# Patient Record
Sex: Female | Born: 1940 | Race: Black or African American | Hispanic: No | Smoking: Former smoker
Health system: Southern US, Community
[De-identification: ages and names within clinical notes are randomized; demographics above are authoritative.]

## PROBLEM LIST (undated history)

## (undated) DIAGNOSIS — R011 Cardiac murmur, unspecified: Secondary | ICD-10-CM

## (undated) DIAGNOSIS — M797 Fibromyalgia: Secondary | ICD-10-CM

## (undated) DIAGNOSIS — E039 Hypothyroidism, unspecified: Secondary | ICD-10-CM

## (undated) DIAGNOSIS — I10 Essential (primary) hypertension: Secondary | ICD-10-CM

## (undated) DIAGNOSIS — M069 Rheumatoid arthritis, unspecified: Secondary | ICD-10-CM

## (undated) DIAGNOSIS — Q385 Congenital malformations of palate, not elsewhere classified: Secondary | ICD-10-CM

## (undated) DIAGNOSIS — R7301 Impaired fasting glucose: Secondary | ICD-10-CM

## (undated) DIAGNOSIS — M7061 Trochanteric bursitis, right hip: Secondary | ICD-10-CM

## (undated) DIAGNOSIS — B0229 Other postherpetic nervous system involvement: Secondary | ICD-10-CM

## (undated) DIAGNOSIS — J45909 Unspecified asthma, uncomplicated: Secondary | ICD-10-CM

## (undated) HISTORY — DX: Rheumatoid arthritis, unspecified: M06.9

## (undated) HISTORY — DX: Other postherpetic nervous system involvement: B02.29

## (undated) HISTORY — DX: Cardiac murmur, unspecified: R01.1

## (undated) HISTORY — DX: Essential (primary) hypertension: I10

## (undated) HISTORY — DX: Unspecified asthma, uncomplicated: J45.909

## (undated) HISTORY — DX: Impaired fasting glucose: R73.01

## (undated) HISTORY — PX: TONSILLECTOMY AND ADENOIDECTOMY: SUR1326

## (undated) HISTORY — DX: Congenital malformations of palate, not elsewhere classified: Q38.5

## (undated) HISTORY — DX: Hypothyroidism, unspecified: E03.9

## (undated) HISTORY — DX: Trochanteric bursitis, right hip: M70.61

## (undated) HISTORY — DX: Hypercalcemia: E83.52

## (undated) HISTORY — DX: Fibromyalgia: M79.7

---

## 1968-07-20 HISTORY — PX: CLEFT PALATE REPAIR: SUR1165

## 1986-07-20 HISTORY — PX: TOTAL ABDOMINAL HYSTERECTOMY W/ BILATERAL SALPINGOOPHORECTOMY: SHX83

## 1986-07-20 HISTORY — PX: BREAST BIOPSY: SHX20

## 1988-07-20 HISTORY — PX: LUMBAR DISC SURGERY: SHX700

## 1998-01-04 ENCOUNTER — Other Ambulatory Visit: Admission: RE | Admit: 1998-01-04 | Discharge: 1998-01-04 | Payer: Self-pay | Admitting: Obstetrics and Gynecology

## 1998-02-04 ENCOUNTER — Encounter: Admission: RE | Admit: 1998-02-04 | Discharge: 1998-05-05 | Payer: Self-pay | Admitting: Infectious Diseases

## 1998-05-24 ENCOUNTER — Encounter (HOSPITAL_COMMUNITY): Admission: RE | Admit: 1998-05-24 | Discharge: 1998-08-22 | Payer: Self-pay | Admitting: Dentistry

## 1998-11-27 ENCOUNTER — Encounter (HOSPITAL_COMMUNITY): Admission: RE | Admit: 1998-11-27 | Discharge: 1999-02-25 | Payer: Self-pay | Admitting: Dentistry

## 2001-02-25 ENCOUNTER — Ambulatory Visit (HOSPITAL_COMMUNITY): Admission: RE | Admit: 2001-02-25 | Discharge: 2001-02-25 | Payer: Self-pay | Admitting: Gastroenterology

## 2001-08-08 ENCOUNTER — Encounter: Admission: RE | Admit: 2001-08-08 | Discharge: 2001-08-08 | Payer: Self-pay

## 2009-03-04 ENCOUNTER — Encounter: Admission: RE | Admit: 2009-03-04 | Discharge: 2009-03-04 | Payer: Self-pay | Admitting: Family Medicine

## 2010-02-13 ENCOUNTER — Encounter: Admission: RE | Admit: 2010-02-13 | Discharge: 2010-02-13 | Payer: Self-pay | Admitting: Rheumatology

## 2010-02-25 ENCOUNTER — Encounter: Admission: RE | Admit: 2010-02-25 | Discharge: 2010-02-25 | Payer: Self-pay | Admitting: Family Medicine

## 2010-10-17 ENCOUNTER — Encounter: Payer: Self-pay | Admitting: Family Medicine

## 2010-10-17 ENCOUNTER — Ambulatory Visit (INDEPENDENT_AMBULATORY_CARE_PROVIDER_SITE_OTHER): Payer: BC Managed Care – PPO | Admitting: Family Medicine

## 2010-10-17 DIAGNOSIS — B0229 Other postherpetic nervous system involvement: Secondary | ICD-10-CM | POA: Insufficient documentation

## 2010-10-17 DIAGNOSIS — I10 Essential (primary) hypertension: Secondary | ICD-10-CM | POA: Insufficient documentation

## 2010-10-17 DIAGNOSIS — R030 Elevated blood-pressure reading, without diagnosis of hypertension: Secondary | ICD-10-CM

## 2010-10-17 DIAGNOSIS — R32 Unspecified urinary incontinence: Secondary | ICD-10-CM

## 2010-10-17 DIAGNOSIS — IMO0001 Reserved for inherently not codable concepts without codable children: Secondary | ICD-10-CM

## 2010-10-17 DIAGNOSIS — E039 Hypothyroidism, unspecified: Secondary | ICD-10-CM

## 2010-10-17 DIAGNOSIS — J45909 Unspecified asthma, uncomplicated: Secondary | ICD-10-CM | POA: Insufficient documentation

## 2010-10-17 DIAGNOSIS — M797 Fibromyalgia: Secondary | ICD-10-CM | POA: Insufficient documentation

## 2010-10-17 DIAGNOSIS — N390 Urinary tract infection, site not specified: Secondary | ICD-10-CM

## 2010-10-17 DIAGNOSIS — J309 Allergic rhinitis, unspecified: Secondary | ICD-10-CM | POA: Insufficient documentation

## 2010-10-17 LAB — POCT URINALYSIS DIPSTICK
Blood, UA: NEGATIVE
Glucose, UA: NEGATIVE
Nitrite, UA: NEGATIVE
Protein, UA: NEGATIVE
Spec Grav, UA: 1.02
Urobilinogen, UA: 0.2
pH, UA: 6

## 2010-10-17 NOTE — Assessment & Plan Note (Signed)
Uses Aleve and cyclobenzaprine and lyrica.

## 2010-10-17 NOTE — Progress Notes (Signed)
  Subjective:    Patient ID: Kiara Clayton, female    DOB: 10/20/1940, 70 y.o.   MRN: [id number]  HPI  Asthma - She does have an inhaler at home but she doesn't know which one. She says she rarely uses it. She will let us know the next time she is here.   Says home BP is usually 108/70s.  Says BP is often high when goes to the doctors office  Says she started thyroid med about 1 year ago. Doing well. Deneis any thyroid sxs.  She think she is not due for labs until may.   C/O of urinary leaking for one month. Says will urinat and then a few minutes later will sit down and leak a little. Not every time. C\coming nad going. She wondered if it was her meds. None of her meds are new. No pelvic pain or fever.   Review of Systems  Constitutional: Negative for diaphoresis and unexpected weight change.  HENT: Negative for hearing loss, sneezing, postnasal drip and tinnitus.   Eyes: Positive for visual disturbance.  Respiratory: Negative for cough and wheezing.   Cardiovascular: Positive for chest pain and palpitations.  Genitourinary: Negative for vaginal bleeding and vaginal discharge.       Nocturia  Musculoskeletal: Positive for myalgias and arthralgias.  Neurological: Negative for headaches.  Hematological: Negative for adenopathy. Does not bruise/bleed easily.  Psychiatric/Behavioral: Positive for sleep disturbance and dysphoric mood.       Objective:   Physical Exam  Constitutional: She appears well-developed and well-nourished.  HENT:  Head: Normocephalic and atraumatic.       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   Cardiovascular: Normal rate, regular rhythm and normal heart sounds.   Pulmonary/Chest: Effort normal and breath sounds normal.          Assessment & Plan:  Urinary leaking - UA only + for LE. Will send for culture. If neg consider kegel exercises, or possible medication.

## 2010-10-17 NOTE — Assessment & Plan Note (Signed)
Continue zyrtec. She says this controls her sxs well.  No change to her regimen.

## 2010-10-17 NOTE — Assessment & Plan Note (Signed)
She thinks she is due to repeat this In May but not sure.

## 2010-10-17 NOTE — Assessment & Plan Note (Signed)
Repeat BP at goal.

## 2010-10-20 ENCOUNTER — Telehealth: Payer: Self-pay | Admitting: Family Medicine

## 2010-10-20 LAB — URINE CULTURE: Colony Count: >=100000

## 2010-10-20 MED ORDER — NITROFURANTOIN MACROCRYSTAL 100 MG PO CAPS: 100.0000 mg | ORAL_CAPSULE | Freq: Four times a day (QID) | ORAL | Status: AC

## 2010-10-20 NOTE — Telephone Encounter (Signed)
Call pt: Culture was + for a UTI.  Abx sent to pharm. If still having sxs after complete the abx then nurse visit to recheck the urine.

## 2010-10-20 NOTE — Telephone Encounter (Signed)
Pt.notified

## 2010-11-14 ENCOUNTER — Ambulatory Visit (INDEPENDENT_AMBULATORY_CARE_PROVIDER_SITE_OTHER): Payer: BC Managed Care – PPO | Admitting: Family Medicine

## 2010-11-14 DIAGNOSIS — N390 Urinary tract infection, site not specified: Secondary | ICD-10-CM

## 2010-11-14 LAB — POCT URINALYSIS DIPSTICK
Bilirubin, UA: NEGATIVE
Blood, UA: NEGATIVE
Glucose, UA: NEGATIVE
Spec Grav, UA: 1.025
Urobilinogen, UA: 0.2

## 2010-11-14 NOTE — Progress Notes (Signed)
  Subjective:    Patient ID: Kiara Clayton, female    DOB: 1941-01-15, 70 y.o.   MRN: [id number]  HPI Leaking urine. Wants to make sure not infection.    Review of Systems     Objective:   Physical Exam        Assessment & Plan:  U/A only + for LE.Will send for culture. Called pt: voicemail hasn't been set up to leave a message. No answer.

## 2010-11-19 ENCOUNTER — Telehealth: Payer: Self-pay | Admitting: Family Medicine

## 2010-11-19 NOTE — Telephone Encounter (Signed)
Call patient: Please let her know her urine culture was re\re incubated for better growth. As soon as we have the final results we will her neck.

## 2010-11-19 NOTE — Telephone Encounter (Signed)
vm has not been set up. Pt states she does not answer phone when she was in the office. She will call us back this week or next week

## 2010-11-21 ENCOUNTER — Telehealth: Payer: Self-pay | Admitting: Family Medicine

## 2010-11-21 LAB — URINE CULTURE: Colony Count: >=100000

## 2010-11-21 MED ORDER — NITROFURANTOIN MACROCRYSTAL 100 MG PO CAPS: 100.0000 mg | ORAL_CAPSULE | Freq: Four times a day (QID) | ORAL | Status: AC

## 2010-11-21 NOTE — Telephone Encounter (Addendum)
Called and there was no answer. Pt stated in the office that she doesn't answer her phone and her vm has not been set up.Pt states she will call next week if she has not heard from [place]

## 2010-11-21 NOTE — Telephone Encounter (Signed)
Call patient: Urine culture was positive. We will call in a prescription for nitrofurantoin.

## 2010-12-03 ENCOUNTER — Encounter: Payer: Self-pay | Admitting: *Deleted

## 2011-01-30 ENCOUNTER — Other Ambulatory Visit: Payer: Self-pay | Admitting: Family Medicine

## 2011-01-30 DIAGNOSIS — Z1231 Encounter for screening mammogram for malignant neoplasm of breast: Secondary | ICD-10-CM

## 2011-02-02 ENCOUNTER — Other Ambulatory Visit: Payer: Self-pay | Admitting: Family Medicine

## 2011-02-03 MED ORDER — LEVOTHYROXINE SODIUM 88 MCG PO TABS: 88.0000 ug | ORAL_TABLET | Freq: Every day | ORAL | Status: DC

## 2011-02-03 MED ORDER — CYCLOBENZAPRINE HCL 10 MG PO TABS: 10.0000 mg | ORAL_TABLET | Freq: Two times a day (BID) | ORAL | Status: DC | PRN

## 2011-03-03 ENCOUNTER — Ambulatory Visit
Admission: RE | Admit: 2011-03-03 | Discharge: 2011-03-03 | Disposition: A | Discharge status: Home / Self Care | Payer: BC Managed Care – PPO | Source: Ambulatory Visit | Attending: Family Medicine | Admitting: Family Medicine

## 2011-03-03 ENCOUNTER — Telehealth: Payer: Self-pay | Admitting: *Deleted

## 2011-03-03 DIAGNOSIS — E039 Hypothyroidism, unspecified: Secondary | ICD-10-CM

## 2011-03-03 DIAGNOSIS — IMO0001 Reserved for inherently not codable concepts without codable children: Secondary | ICD-10-CM

## 2011-03-03 DIAGNOSIS — Z1231 Encounter for screening mammogram for malignant neoplasm of breast: Secondary | ICD-10-CM

## 2011-03-03 DIAGNOSIS — E785 Hyperlipidemia, unspecified: Secondary | ICD-10-CM

## 2011-03-03 NOTE — Telephone Encounter (Signed)
Pt needs labs.

## 2011-03-04 ENCOUNTER — Telehealth: Payer: Self-pay | Admitting: Family Medicine

## 2011-03-04 LAB — LIPID PANEL
HDL: 58 mg/dL (ref >39)
LDL Cholesterol: 201 mg/dL — ABNORMAL HIGH (ref 0–99)
Total CHOL/HDL Ratio: 5 Ratio
VLDL: 32 mg/dL (ref 0–40)

## 2011-03-04 LAB — COMPLETE METABOLIC PANEL WITH GFR
ALT: 19 U/L (ref 0–35)
AST: 25 U/L (ref 0–37)
Albumin: 4.6 g/dL (ref 3.5–5.2)
Alkaline Phosphatase: 76 U/L (ref 39–117)
Potassium: 4 mEq/L (ref 3.5–5.3)
Sodium: 140 mEq/L (ref 135–145)
Total Bilirubin: 0.6 mg/dL (ref 0.3–1.2)
Total Protein: 8.3 g/dL (ref 6.0–8.3)

## 2011-03-04 LAB — TSH: TSH: 2.659 u[IU]/mL (ref 0.350–4.500)

## 2011-03-04 NOTE — Telephone Encounter (Signed)
Call patient: Complete metabolic panel is normal except for her calcium is just a little borderline elevated. I would like to recheck this in one month. Her cholesterol is way too high. She really needs to be on cholesterol medication at bedtime. Her thyroid level is normal. Let me know if this is a patient who doesn't have an active phone number.

## 2011-03-05 ENCOUNTER — Telehealth: Payer: Self-pay | Admitting: Family Medicine

## 2011-03-05 NOTE — Telephone Encounter (Signed)
Patient: Her mammogram was slightly abnormal and they should be contacting her to do additional studies.

## 2011-03-05 NOTE — Telephone Encounter (Signed)
LM on VM with contact Olegario Messier her niece to have pt call office for lab results and to give [place] a number. KJ LPN

## 2011-03-06 ENCOUNTER — Telehealth: Payer: Self-pay | Admitting: Family Medicine

## 2011-03-06 ENCOUNTER — Other Ambulatory Visit: Payer: Self-pay | Admitting: Family Medicine

## 2011-03-06 DIAGNOSIS — Z1211 Encounter for screening for malignant neoplasm of colon: Secondary | ICD-10-CM

## 2011-03-06 DIAGNOSIS — R928 Other abnormal and inconclusive findings on diagnostic imaging of breast: Secondary | ICD-10-CM

## 2011-03-06 NOTE — Telephone Encounter (Signed)
Patient was in the office and ask for a referral to get a colonoscopy in [place]

## 2011-03-06 NOTE — Telephone Encounter (Signed)
Addended by: Nani Gasser D on: 03/06/2011 10:39 AM   Modules accepted: Orders

## 2011-03-06 NOTE — Telephone Encounter (Signed)
Referral entered  

## 2011-03-06 NOTE — Telephone Encounter (Signed)
Pt notified via her niece of lab results. KJ LPN

## 2011-03-10 ENCOUNTER — Encounter: Payer: Self-pay | Admitting: *Deleted

## 2011-03-10 NOTE — Telephone Encounter (Signed)
Pt's niece was notified and will mail letter and let niece know that we need to get a phone number for her that we can reach her

## 2011-03-19 ENCOUNTER — Ambulatory Visit
Admission: RE | Admit: 2011-03-19 | Discharge: 2011-03-19 | Disposition: A | Discharge status: Home / Self Care | Payer: Medicare Other | Source: Ambulatory Visit | Attending: Family Medicine | Admitting: Family Medicine

## 2011-03-19 ENCOUNTER — Other Ambulatory Visit: Payer: Self-pay | Admitting: Family Medicine

## 2011-03-19 DIAGNOSIS — Z9889 Other specified postprocedural states: Secondary | ICD-10-CM

## 2011-03-19 DIAGNOSIS — R928 Other abnormal and inconclusive findings on diagnostic imaging of breast: Secondary | ICD-10-CM

## 2011-03-20 ENCOUNTER — Ambulatory Visit
Admission: RE | Admit: 2011-03-20 | Discharge: 2011-03-20 | Disposition: A | Discharge status: Home / Self Care | Payer: Medicare Other | Source: Ambulatory Visit | Attending: Family Medicine | Admitting: Family Medicine

## 2011-03-20 DIAGNOSIS — Z9889 Other specified postprocedural states: Secondary | ICD-10-CM

## 2011-04-23 ENCOUNTER — Other Ambulatory Visit: Payer: Self-pay | Admitting: *Deleted

## 2011-04-23 MED ORDER — LEVOTHYROXINE SODIUM 88 MCG PO TABS: 88.0000 ug | ORAL_TABLET | Freq: Every day | ORAL | Status: DC

## 2011-04-23 MED ORDER — CYCLOBENZAPRINE HCL 10 MG PO TABS: 10.0000 mg | ORAL_TABLET | Freq: Two times a day (BID) | ORAL | Status: DC | PRN

## 2011-10-20 ENCOUNTER — Other Ambulatory Visit: Payer: Self-pay | Admitting: Family Medicine

## 2011-11-23 ENCOUNTER — Encounter: Payer: Self-pay | Admitting: Family Medicine

## 2011-11-23 ENCOUNTER — Ambulatory Visit (INDEPENDENT_AMBULATORY_CARE_PROVIDER_SITE_OTHER): Payer: Medicare Other | Admitting: Family Medicine

## 2011-11-23 VITALS — BP 152/83 | HR 98 | Ht 66.0 in | Wt 190.0 lb

## 2011-11-23 DIAGNOSIS — R030 Elevated blood-pressure reading, without diagnosis of hypertension: Secondary | ICD-10-CM

## 2011-11-23 DIAGNOSIS — B0229 Other postherpetic nervous system involvement: Secondary | ICD-10-CM

## 2011-11-23 DIAGNOSIS — E039 Hypothyroidism, unspecified: Secondary | ICD-10-CM

## 2011-11-23 DIAGNOSIS — IMO0001 Reserved for inherently not codable concepts without codable children: Secondary | ICD-10-CM

## 2011-11-23 MED ORDER — CYCLOBENZAPRINE HCL 10 MG PO TABS: 10.0000 mg | ORAL_TABLET | Freq: Two times a day (BID) | ORAL | Status: DC | PRN

## 2011-11-23 NOTE — Patient Instructions (Signed)
We will call you with your lab results. If you don't here from [place] in about a week then please give [place] a call at [phone]. We also need to check you choleseterol soon too.

## 2011-11-23 NOTE — Progress Notes (Addendum)
  Subjective:    Patient ID: Kiara Clayton, female    DOB: [birth year], 71 y.o.   MRN: [id number]  HPI  Thyroid - She is doing well. No major change in weight. Says the lyrica made he gain a few pounds but says she has [place] bout lost those.  No skin or hair changes.  Taking her medication regularly.    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  Used to be on Lyrica for it.  Wearing pants too long will aggrevate her sxs as well.   Review of Systems     Objective:   Physical Exam  Constitutional: She is oriented to person, place, and time. She appears well-developed and well-nourished.  HENT:  Head: Normocephalic and atraumatic.  Neck: Neck supple. No thyromegaly present.  Cardiovascular: Normal rate, regular rhythm and normal heart sounds.   Pulmonary/Chest: Effort normal and breath sounds normal.  Lymphadenopathy:    She has no cervical adenopathy.  Neurological: She is alert and oriented to person, place, and time.  Skin: Skin is warm and dry.  Psychiatric: She has a normal mood and affect. Her behavior is normal.          Assessment & Plan:  Thyroid - Recheck TSH. Has been over a year.    Elevated BP- High today. Return in 2 weeks for nurse BP check.  Post herpetic neuralgia. Declines any med today.

## 2011-11-24 ENCOUNTER — Other Ambulatory Visit: Payer: Self-pay | Admitting: *Deleted

## 2011-11-24 LAB — COMPLETE METABOLIC PANEL WITH GFR
ALT: 32 U/L (ref 0–35)
AST: 37 U/L (ref 0–37)
Calcium: 10.4 mg/dL (ref 8.4–10.5)
Chloride: 106 mEq/L (ref 96–112)
Creat: 0.83 mg/dL (ref 0.50–1.10)
Potassium: 3.8 mEq/L (ref 3.5–5.3)

## 2011-11-24 LAB — TSH: TSH: 3.384 u[IU]/mL (ref 0.350–4.500)

## 2011-11-24 MED ORDER — LEVOTHYROXINE SODIUM 88 MCG PO TABS: 88.0000 ug | ORAL_TABLET | Freq: Every day | ORAL | Status: DC

## 2011-11-24 NOTE — Progress Notes (Signed)
Quick Note:  All labs are normal. ______ 

## 2011-11-26 ENCOUNTER — Encounter: Payer: Self-pay | Admitting: Family Medicine

## 2011-11-26 ENCOUNTER — Ambulatory Visit (INDEPENDENT_AMBULATORY_CARE_PROVIDER_SITE_OTHER): Payer: Medicare Other | Admitting: Family Medicine

## 2011-11-26 VITALS — BP 158/81 | HR 94 | Ht 66.0 in | Wt 193.0 lb

## 2011-11-26 DIAGNOSIS — E039 Hypothyroidism, unspecified: Secondary | ICD-10-CM

## 2011-11-26 DIAGNOSIS — I10 Essential (primary) hypertension: Secondary | ICD-10-CM

## 2011-11-26 NOTE — Progress Notes (Signed)
  Subjective:    Patient ID: Kiara Clayton, female    DOB: 11-21-1940, 71 y.o.   MRN: [id number]  HPI HTN - she still has an appointment scheduled in 2 weeks for a nurse blood pressure check. She says she try taking over-the-counter supplements and vitamins and a kidney closer to help with her blood pressure. She still really wants to stay off of prescription medication and possible. She also is over her lab results as she had done a few days ago. She had a CMP that was normal at thyroid level that was normal.    Review of Systems     Objective:   Physical Exam  Constitutional: She is oriented to person, place, and time. She appears well-developed and well-nourished.  HENT:  Head: Normocephalic and atraumatic.  Cardiovascular: Normal rate, regular rhythm and normal heart sounds.   Pulmonary/Chest: Effort normal and breath sounds normal.  Neurological: She is alert and oriented to person, place, and time.  Skin: Skin is warm.  Psychiatric: She has a normal mood and affect. Her behavior is normal.          Assessment & Plan:  HTN-uncontrolled. We discussed that she does need to be on a prescription medication but she really wants to give her supplements at least 2 weeks to see if they're helping her. I also reviewed with her the DASH diet and give her a brief handout. She has not get on the Internet and does not have access to a full copy.  We also reviewed her lab results which. Her thyroid is well controlled. She was given a copy.

## 2011-11-26 NOTE — Patient Instructions (Signed)
DASH Diet The DASH diet stands for "Dietary Approaches to Stop Hypertension." It is a healthy eating plan that has been shown to reduce high blood pressure (hypertension) in as little as 14 days, while also possibly providing other significant health benefits. These other health benefits include reducing the risk of breast cancer after menopause and reducing the risk of type 2 diabetes, heart disease, colon cancer, and stroke. Health benefits also include weight loss and slowing kidney failure in patients with chronic kidney disease.   DIET GUIDELINES  Limit salt (sodium). Your diet should contain less than 1500 mg of sodium daily.   Limit refined or processed carbohydrates. Your diet should include mostly whole grains. Desserts and added sugars should be used sparingly.   Include small amounts of heart-healthy fats. These types of fats include nuts, oils, and tub margarine. Limit saturated and trans fats. These fats have been shown to be harmful in the body.  CHOOSING FOODS   The following food groups are based on a 2000 calorie diet. See your Registered Dietitian for individual calorie needs. Grains and Grain Products (6 to 8 servings daily)  Eat More Often: Whole-wheat bread, brown rice, whole-grain or wheat pasta, quinoa, popcorn without added fat or salt (air popped).   Eat Less Often: White bread, white pasta, white rice, cornbread.  Vegetables (4 to 5 servings daily)  Eat More Often: Fresh, frozen, and canned vegetables. Vegetables may be raw, steamed, roasted, or grilled with a minimal amount of fat.   Eat Less Often/Avoid: Creamed or fried vegetables. Vegetables in a cheese sauce.  Fruit (4 to 5 servings daily)  Eat More Often: All fresh, canned (in natural juice), or frozen fruits. Dried fruits without added sugar. One hundred percent fruit juice ( cup [237 mL] daily).   Eat Less Often: Dried fruits with added sugar. Canned fruit in light or heavy syrup.  Lean Meats, Fish, and  Poultry (2 servings or less daily. One serving is 3 to 4 oz [85-114 g]).  Eat More Often: Ninety percent or leaner ground beef, tenderloin, sirloin. Round cuts of beef, chicken breast, turkey breast. All fish. Grill, bake, or broil your meat. Nothing should be fried.   Eat Less Often/Avoid: Fatty cuts of meat, turkey, or chicken leg, thigh, or wing. Fried cuts of meat or fish.  Dairy (2 to 3 servings)  Eat More Often: Low-fat or fat-free milk, low-fat plain or light yogurt, reduced-fat or part-skim cheese.   Eat Less Often/Avoid: Milk (whole, 2%, skim, or chocolate). Whole milk yogurt. Full-fat cheeses.  Nuts, Seeds, and Legumes (4 to 5 servings per week)  Eat More Often: All without added salt.   Eat Less Often/Avoid: Salted nuts and seeds, canned beans with added salt.  Fats and Sweets (limited)  Eat More Often: Vegetable oils, tub margarines without trans fats, sugar-free gelatin. Mayonnaise and salad dressings.   Eat Less Often/Avoid: Coconut oils, palm oils, butter, stick margarine, cream, half and half, cookies, candy, pie.  FOR MORE INFORMATION The Dash Diet Eating Plan: www.dashdiet.org Document Released: 06/25/2011 Document Reviewed: 06/15/2011 [employer] Patient Information 2012 [employer].1.5 Gram Low Sodium Diet A 1.5 gram sodium diet restricts the amount of sodium in the diet to no more than 1.5 g or 1500 mg daily. The American Heart Association recommends Americans over the age of 20 to consume no more than 1500 mg of sodium each day to reduce the risk of developing high blood pressure. Research also shows that limiting sodium may reduce heart attack   and stroke risk. Many foods contain sodium for flavor and sometimes as a preservative. When the amount of sodium in a diet needs to be low, it is important to know what to look for when choosing foods and drinks. The following includes some information and guidelines to help make it easier for you to adapt to a low sodium  diet. QUICK TIPS  Do not add salt to food.   Avoid convenience items and fast food.   Choose unsalted snack foods.   Buy lower sodium products, often labeled as "lower sodium" or "no salt added."   Check food labels to learn how much sodium is in 1 serving.   When eating at a restaurant, ask that your food be prepared with less salt or none, if possible.  READING FOOD LABELS FOR SODIUM INFORMATION The nutrition facts label is a good place to find how much sodium is in foods. Look for products with no more than 400 mg of sodium per serving. Remember that 1.5 g = 1500 mg. The food label may also list foods as:  Sodium-free: Less than 5 mg in a serving.   Very low sodium: 35 mg or less in a serving.   Low-sodium: 140 mg or less in a serving.   Light in sodium: 50% less sodium in a serving. For example, if a food that usually has 300 mg of sodium is changed to become light in sodium, it will have 150 mg of sodium.   Reduced sodium: 25% less sodium in a serving. For example, if a food that usually has 400 mg of sodium is changed to reduced sodium, it will have 300 mg of sodium.  CHOOSING FOODS Grains  Avoid: Salted crackers and snack items. Some cereals, including instant hot cereals. Bread stuffing and biscuit mixes. Seasoned rice or pasta mixes.   Choose: Unsalted snack items. Low-sodium cereals, oats, puffed wheat and rice, shredded wheat. English muffins and bread. Pasta.  Meats  Avoid: Salted, canned, smoked, spiced, pickled meats, including fish and poultry. Bacon, ham, sausage, cold cuts, hot dogs, anchovies.   Choose: Low-sodium canned tuna and salmon. Fresh or frozen meat, poultry, and fish.  Dairy  Avoid: Processed cheese and spreads. Cottage cheese. Buttermilk and condensed milk. Regular cheese.   Choose: Milk. Low-sodium cottage cheese. Yogurt. Sour cream. Low-sodium cheese.  Fruits and Vegetables  Avoid: Regular canned vegetables. Regular canned tomato sauce and  paste. Frozen vegetables in sauces. Olives. Pickles. Relishes. Sauerkraut.   Choose: Low-sodium canned vegetables. Low-sodium tomato sauce and paste. Frozen or fresh vegetables. Fresh and frozen fruit.  Condiments  Avoid: Canned and packaged gravies. Worcestershire sauce. Tartar sauce. Barbecue sauce. Soy sauce. Steak sauce. Ketchup. Onion, garlic, and table salt. Meat flavorings and tenderizers.   Choose: Fresh and dried herbs and spices. Low-sodium varieties of mustard and ketchup. Lemon juice. Tabasco sauce. Horseradish.  SAMPLE 1.5 GRAM SODIUM MEAL PLAN Breakfast / Sodium (mg)  1 cup low-fat milk / 143 mg   1 whole-wheat English muffin / 240 mg   1 tbs heart-healthy margarine / 153 mg   1 hard-boiled egg / 139 mg   1 small orange / 0 mg  Lunch / Sodium (mg)  1 cup raw carrots / 76 mg   2 tbs no salt added peanut butter / 5 mg   2 slices whole-wheat bread / 270 mg   1 tbs jelly / 6 mg    cup red grapes / 2 mg  Dinner / Sodium (mg)    1 cup whole-wheat pasta / 2 mg   1 cup low-sodium tomato sauce / 73 mg   3 oz lean ground beef / 57 mg   1 small side salad (1 cup raw spinach leaves,  cup cucumber,  cup yellow bell pepper) with 1 tsp olive oil and 1 tsp red wine vinegar / 25 mg  Snack / Sodium (mg)  1 container low-fat vanilla yogurt / 107 mg   3 graham cracker squares / 127 mg  Nutrient Analysis  Calories: 1745   Protein: 75 g   Carbohydrate: 237 g   Fat: 57 g   Sodium: 1425 mg  Document Released: 07/06/2005 Document Revised: 06/25/2011 Document Reviewed: 10/07/2009 [employer] Patient Information 2012 [employer]. 

## 2011-12-10 ENCOUNTER — Ambulatory Visit (INDEPENDENT_AMBULATORY_CARE_PROVIDER_SITE_OTHER): Payer: Medicare Other | Admitting: Family Medicine

## 2011-12-10 ENCOUNTER — Encounter: Payer: Self-pay | Admitting: Family Medicine

## 2011-12-10 VITALS — BP 179/84 | HR 66 | Ht 66.0 in | Wt 193.0 lb

## 2011-12-10 DIAGNOSIS — H9190 Unspecified hearing loss, unspecified ear: Secondary | ICD-10-CM | POA: Insufficient documentation

## 2011-12-10 DIAGNOSIS — R29898 Other symptoms and signs involving the musculoskeletal system: Secondary | ICD-10-CM

## 2011-12-10 DIAGNOSIS — I10 Essential (primary) hypertension: Secondary | ICD-10-CM

## 2011-12-10 DIAGNOSIS — R2240 Localized swelling, mass and lump, unspecified lower limb: Secondary | ICD-10-CM

## 2011-12-10 MED ORDER — LISINOPRIL 20 MG PO TABS: 20.0000 mg | ORAL_TABLET | Freq: Every day | ORAL | Status: DC

## 2011-12-10 NOTE — Progress Notes (Signed)
  Subjective:    Patient ID: Kiara Clayton, female    DOB: 21-Aug-1940, 71 y.o.   MRN: [id number]  HPI Noticed a lump on her right hip a couple of months ago. Says it has gotten smaller.  Her rheumatologist saw it and gave her reassurance.  Not tender or bothersome except occasionally during exercise she says it will become uncomfortable and feel tight. She denies any trauma. She wonders if it could be an old injection site from steroids years ago.  Here to f/u HTN - He wanted to try some over-the-counter supplements for a couple weeks before he started a prescription. Her blood pressure still high today. No CP or SOB.  Thyroid levels were normal earlier this month.   Review of Systems     Objective:   Physical Exam  Constitutional: She is oriented to person, place, and time. She appears well-developed and well-nourished.  HENT:  Head: Normocephalic and atraumatic.  Cardiovascular: Normal rate, regular rhythm and normal heart sounds.   Pulmonary/Chest: Effort normal and breath sounds normal.  Musculoskeletal:       Firm knot on the right hip approx 4-5 inches. Nontender.    Neurological: She is alert and oriented to person, place, and time.  Skin: Skin is warm and dry.  Psychiatric: She has a normal mood and affect. Her behavior is normal.          Assessment & Plan:  HTN- uncontrolled. He wanted to try some over-the-counter supplements for a couple weeks before he started a prescription. Her blood pressure still high today. We will start lisinopril 20 mg daily. I sent to her pharmacy. Followup in 3-4 weeks to make sure it working well and make sure she is tolerating it well. We did review potential side effects. I also explained repercusions of not treating her high blood pressure. Check BMP at followup.  Mass on hip - it feels similar to a lipoma. Says it is not bothering her we will keep an eye on it. Her rheumatologist is also following it. If it is still there in 2 months and I  recommend my partner examine with ultrasound.

## 2011-12-10 NOTE — Patient Instructions (Signed)

## 2011-12-11 MED ORDER — LISINOPRIL 20 MG PO TABS: 20.0000 mg | ORAL_TABLET | Freq: Every day | ORAL | Status: DC

## 2011-12-11 NOTE — Progress Notes (Signed)
Addended by: Barry Dienes A on: 12/11/2011 07:34 AM   Modules accepted: Orders

## 2012-01-01 ENCOUNTER — Ambulatory Visit (INDEPENDENT_AMBULATORY_CARE_PROVIDER_SITE_OTHER): Payer: Medicare Other | Admitting: Family Medicine

## 2012-01-01 ENCOUNTER — Encounter: Payer: Self-pay | Admitting: Family Medicine

## 2012-01-01 ENCOUNTER — Ambulatory Visit: Payer: Medicare Other | Admitting: Family Medicine

## 2012-01-01 VITALS — BP 138/85 | HR 114 | Wt 191.0 lb

## 2012-01-01 DIAGNOSIS — I10 Essential (primary) hypertension: Secondary | ICD-10-CM

## 2012-01-01 NOTE — Progress Notes (Signed)
  Subjective:    Patient ID: Kiara Clayton, female    DOB: [birth year], 71 y.o.   MRN: [id number]  HPI HTN - She is taking her meds ruglarly. No CP or SOB. Tolerating it well. It has taken months to get hr to start he rmedications.  She has been running around and thinks this may be contributing to her pressure today.   Review of Systems     Objective:   Physical Exam  Constitutional: She is oriented to person, place, and time. She appears well-developed and well-nourished.  HENT:  Head: Normocephalic and atraumatic.  Cardiovascular: Normal rate, regular rhythm and normal heart sounds.   Pulmonary/Chest: Effort normal and breath sounds normal.  Neurological: She is alert and oriented to person, place, and time.  Skin: Skin is warm and dry.  Psychiatric: She has a normal mood and affect. Her behavior is normal.          Assessment & Plan:  HTN- much improved. I would like to see it a little lower thought. F/U again in 2 weeks to make sure well controlled. She has been trying watch the salt in her diet and in fact has actually lost 2 pounds which is fantastic. Encouraged her to keep this up for work on regular exercise and continue to eat a low-salt diet. Her rheumatologist who commented that her blood pressure was looking better.

## 2012-01-02 LAB — BASIC METABOLIC PANEL WITH GFR
BUN: 15 mg/dL (ref 6–23)
GFR, Est African American: >89 mL/min
Glucose, Bld: 125 mg/dL — ABNORMAL HIGH (ref 70–99)
Potassium: 3.8 mEq/L (ref 3.5–5.3)

## 2012-01-25 ENCOUNTER — Telehealth: Payer: Self-pay | Admitting: Family Medicine

## 2012-01-25 ENCOUNTER — Ambulatory Visit (INDEPENDENT_AMBULATORY_CARE_PROVIDER_SITE_OTHER): Payer: Medicare Other | Admitting: Family Medicine

## 2012-01-25 ENCOUNTER — Encounter: Payer: Self-pay | Admitting: Family Medicine

## 2012-01-25 VITALS — BP 156/92 | HR 60 | Ht 66.0 in | Wt 192.0 lb

## 2012-01-25 DIAGNOSIS — R7309 Other abnormal glucose: Secondary | ICD-10-CM

## 2012-01-25 DIAGNOSIS — I10 Essential (primary) hypertension: Secondary | ICD-10-CM

## 2012-01-25 DIAGNOSIS — E039 Hypothyroidism, unspecified: Secondary | ICD-10-CM

## 2012-01-25 DIAGNOSIS — Z9181 History of falling: Secondary | ICD-10-CM

## 2012-01-25 DIAGNOSIS — E785 Hyperlipidemia, unspecified: Secondary | ICD-10-CM

## 2012-01-25 DIAGNOSIS — Z1331 Encounter for screening for depression: Secondary | ICD-10-CM

## 2012-01-25 DIAGNOSIS — Z23 Encounter for immunization: Secondary | ICD-10-CM

## 2012-01-25 MED ORDER — LEVOTHYROXINE SODIUM 88 MCG PO TABS: 88.0000 ug | ORAL_TABLET | Freq: Every day | ORAL | Status: DC

## 2012-01-25 MED ORDER — LISINOPRIL-HYDROCHLOROTHIAZIDE 20-12.5 MG PO TABS: 1.0000 | ORAL_TABLET | Freq: Every day | ORAL | Status: DC

## 2012-01-25 NOTE — Progress Notes (Signed)
  Subjective:    Patient ID: Kiara Clayton, female    DOB: 10-24-1940, 71 y.o.   MRN: [id number]  HPI She is here to followup blood pressure. We will give her prescription for lisinopril as 100 she tolerated it well without any side effects. Chest pain or short of breath. She ran out of it a couple of days ago. She was post followup in 2 weeks but then, month. Thus unfortunately I don't know exactly what her blood pressure has done on the medication.  She does report a little bit more congestion in her throat this summer. No significant postnasal drip. No fever. No other cold symptoms.   Review of Systems     Objective:   Physical Exam  Constitutional: She is oriented to person, place, and time. She appears well-developed and well-nourished.  HENT:  Head: Normocephalic and atraumatic.  Cardiovascular: Normal rate, regular rhythm and normal heart sounds.   Pulmonary/Chest: Effort normal and breath sounds normal.  Neurological: She is alert and oriented to person, place, and time.  Skin: Skin is warm and dry.  Psychiatric: She has a normal mood and affect. Her behavior is normal.          Assessment & Plan:  Hypertension-I would like to change her to lisinopril/HCTZ. Followup in one month for repeat check. If a refill on her prescription.  Hypothyroidism-she would like a 90 day refill versus the 30 day. Sent to her pharmacy. If they will not fill it is a 90 day that she may be required to use her mail-order pharmacy.  Pneumonia vaccine given today.  Hyperlipidemia-the look at her records it has been a year since we checked her cholesterol and was elevated at that time. Due for repeat check. Given lab slip today.  Fall screening-score assessment score of 19 (high risk). Will mail H.O on how to reduce fall risk in the home.   Depression screen-PHQ 9 score of 21. Positive screen for depression. Her lites have her come back so we can specifically discussed her mood and discuss  treatment for depression.

## 2012-01-25 NOTE — Patient Instructions (Signed)
Home Safety and Preventing Falls Falls are a leading cause of injury and while they affect all age groups, falls have greater short-term and long-term impact on older age groups. However, falls should not be a part of life or aging. It is possible for individuals and their families to use preventive measures to significantly decrease the likelihood that anyone, especially an older adult, will fall. There are many simple measures which can make your home safer with respect to preventing falls. The following actions can help reduce falls among all members of your family and are especially important as you age, when your balance, lower limb strength, coordination, and eyesight may be declining. The use of preventive measures will help to reduce you and your family's risk of falls and serious medical consequences. OUTDOORS  Repair cracks and edges of walkways and driveways.   Remove high doorway thresholds and trim shrubbery on the main path into your home.   Ensure there is good outside lighting at main entrances and along main walkways.   Clear walkways of tools, rocks, debris, and clutter.   Check that handrails are not broken and are securely fastened. Both sides of steps should have handrails.   In the garage, be attentive to and clean up grease or oil spills on the cement. This can make the surface extremely slippery.   In winter, have leaves, snow, and ice cleared regularly.   Use sand or salt on walkways during winter months.  BATHROOM  Install grab bars by the toilet and in the tub and shower.   Use non-skid mats or decals in the tub or shower.   If unable to easily stand unsupported while showering, place a plastic non slip stool in the shower to sit on when needed.   Install night lights.   Keep floors dry and clean up all water on the floor immediately.   Remove soap buildup in tub or shower on a regular basis.   Secure bath mats with non-slip, double-sided rug tape.    Remove tripping hazards from the floors.  BEDROOMS  Install night lights.   Do not use oversized bedding.   Make sure a bedside light is easy to reach.   Keep a telephone by your bedside.   Make sure that you can get in and out of your bed easily.   Have a firm chair, with side arms, to use for getting dressed.   Remove clutter from around closets.   Store clothing, bed coverings, and other household items where you can reach them comfortably.   Remove tripping hazards from the floor.  LIVING AREAS AND STAIRWAYS  Turn on lights to avoid having to walk through dark areas.   Keep lighting uniform in each room. Place brighter lightbulbs in darker areas, including stairways.   Replace lightbulbs that burn out in stairways immediately.   Arrange furniture to provide for clear pathways.   Keep furniture in the same place.   Eliminate or tape down electrical cables in high traffic areas.   Place handrails on both sides of stairways. Use handrails when going up or down stairs.   Most falls occur on the top or bottom 3 steps.   Fix any loose handrails. Make sure handrails on both sides of the stairways are as long as the stairs.   Remove all walkway obstacles.   Coil or tape electrical cords off to the side of walking areas and out of the way. If using many extension cords, have an electrician   put in a new wall outlet to reduce or eliminate them.   Make sure spills are cleaned up quickly and allow time for drying before walking on freshly cleaned floors.   Firmly attach carpet with non-skid or two-sided tape.   Keep frequently used items within easy reach.   Remove tripping hazards such as throw rugs and clutter in walkways. Never leave objects on stairs.   Get rid of throw rugs elsewhere if possible.   Eliminate uneven floor surfaces.   Make sure couches and chairs are easy to get into and out of.   Check carpeting to make sure it is firmly attached along stairs.    Make repairs to worn or loose carpet promptly.   Select a carpet pattern that does not visually hide the edge of steps.   Avoid placing throw rugs or scatter rugs at the top or bottom of stairways, or properly secure with carpet tape to prevent slippage.   Have an electrician put in a light switch at the top and bottom of the stairs.   Get light switches that glow.   Avoid the following practices: hurrying, inattention, obscured vision, carrying large loads, and wearing slip-on shoes.   Be aware of all pets.  KITCHEN  Place items that are used frequently, such as dishes and food, within easy reach.   Keep handles on pots and pans toward the center of the stove. Use back burners when possible.   Make sure spills are cleaned up quickly and allow time for drying.   Avoid walking on wet floors.   Avoid hot utensils and knives.   Position shelves so they are not too high or low.   Place commonly used objects within easy reach.   If necessary, use a sturdy step stool with a grab bar when reaching.   Make sure electrical cables are out of the way.   Do not use floor polish or wax that makes floors slippery.  OTHER HOME FALL PREVENTION STRATEGIES  Wear low heel or rubber sole shoes that are supportive and fit well.   Wear closed toe shoes.   Know and watch for side effects of medications. Have your caregiver or pharmacist look at all your medicines, even over-the-counter medicines. Some medicines can make you sleepy or dizzy.   Exercise regularly. Exercise makes you stronger and improves your balance and coordination.   Limit use of alcohol.   Use eyeglasses if necessary and keep them clean. Have your vision checked every year.   Organize your household in a manner that minimizes the need to walk distances when hurried, or go up and down stairs unnecessarily. For example, have a phone placed on at least each floor of your home. If possible, have a phone beside each sitting  or lying area where you spend the most time at home. Keep emergency numbers posted at all phones.   Use non-skid floor wax.   When using a ladder, make sure:   The base is firm.   All ladder feet are on level ground.   The ladder is angled against the wall properly.   When climbing a ladder, face the ladder and hold the ladder rungs firmly.   If reaching, always keep your hips and body weight centered between the rails.   When using a stepladder, make sure it is fully opened and both spreaders are firmly locked.   Do not climb a closed stepladder.   Avoid climbing beyond the second step from the top   of a stepladder and the 4th rung from the top of an extension ladder.   Learn and use mobility aids as needed.   Change positions slowly. Arise slowly from sitting and lying positions. Sit on the edge of your bed before getting to your feet.   If you have a history of falls, ask someone to add color or contrast paint or tape to grab bars and handrails in your home.   If you have a history of falls, ask someone to place contrasting color strips on first and last steps.   Install an electrical emergency response system if you need one, and know how to use it.   If you have a medical or other condition that causes you to have limited physical strength, it is important that you reach out to family and friends for occasional help.  FOR CHILDREN:  If young children are in the home, use safety gates. At the top of stairs use screw-mounted gates; use pressure-mounted gates for the bottom of the stairs and doorways between rooms.   Young children should be taught to descend stairs on their stomachs, feet first, and later using the handrail.   Keep drawers fully closed to prevent them from being climbed on or pulled out entirely.   Move chairs, cribs, beds and other furniture away from windows.   Consider installing window guards on windows ground floor and up, unless they are emergency  fire exits. Make sure they have easy release mechanisms.   Consider installing special locks that only allow the window to be opened to a certain height.   Never rely on window screens to prevent falls.   Never leave babies alone on changing tables, beds or sofas. Use a changing table that has a restraining strap.   When a child can pull to a standing position, the crib mattress should be adjusted to its lowest position. There should be at least 26 inches between the top rails of the crib drop side and the mattress. Toys, bumper pads, and other objects that can be used as steps to climb out should be removed from the crib.   On bunk beds never allow a child under age 6 to sleep on the top bunk. For older children, if the upper bunk is not against a wall, use guard rails on both sides. No matter how old a child is, keep the guard rails in place on the top bunk since children roll during sleep. Do not permit horseplay on bunks.   Grass and soil surfaces beneath backyard playground equipment should be replaced with hardwood chips, shredded wood mulch, sand, pea gravel, rubber, crushed stone, or another safer material at depths of at least 9 to 12 inches.   When riding bikes or using skates, skateboards, skis, or snowboards, require children to wear helmets. Look for those that have stickers stating that they meet or exceed safety standards.   Vertical posts or pickets in deck, balcony, and stairway railings should be no more than 3 1/2 inches apart if a young baby will have access to the area. The space between horizontal rails or bars, and between the floor and the first horizontal rail or bar, should be no more than 3 1/2 inches.  Document Released: 06/26/2002 Document Revised: 06/25/2011 Document Reviewed: 04/25/2009 [employer] Patient Information 2012 [employer]. 

## 2012-01-25 NOTE — Telephone Encounter (Signed)
Call patient: She did screen positive for depression with a questionnarie I gave her. Please ask her to schedule followups that we can discuss further and discuss treatment if she is at all interested. Ill also mail her a handout to her home that has some information to help reduce her risk of falls in the home.

## 2012-01-26 NOTE — Telephone Encounter (Signed)
Left detailed message on vm.

## 2012-02-03 ENCOUNTER — Encounter: Payer: Self-pay | Admitting: Family Medicine

## 2012-02-03 ENCOUNTER — Telehealth: Payer: Self-pay | Admitting: *Deleted

## 2012-02-03 ENCOUNTER — Encounter: Payer: Self-pay | Admitting: *Deleted

## 2012-02-03 DIAGNOSIS — R7301 Impaired fasting glucose: Secondary | ICD-10-CM | POA: Insufficient documentation

## 2012-02-03 LAB — COMPLETE METABOLIC PANEL WITH GFR
ALT: 23 U/L (ref 0–35)
AST: 27 U/L (ref 0–37)
Alkaline Phosphatase: 55 U/L (ref 39–117)
CO2: 25 mEq/L (ref 19–32)
Sodium: 142 mEq/L (ref 135–145)
Total Bilirubin: 0.8 mg/dL (ref 0.3–1.2)
Total Protein: 7.5 g/dL (ref 6.0–8.3)

## 2012-02-03 LAB — LIPID PANEL
HDL: 49 mg/dL (ref >39)
LDL Cholesterol: 159 mg/dL — ABNORMAL HIGH (ref 0–99)
Total CHOL/HDL Ratio: 4.8 Ratio
VLDL: 29 mg/dL (ref 0–40)

## 2012-02-16 ENCOUNTER — Other Ambulatory Visit: Payer: Self-pay | Admitting: Family Medicine

## 2012-02-17 ENCOUNTER — Other Ambulatory Visit: Payer: Self-pay | Admitting: *Deleted

## 2012-02-17 MED ORDER — CYCLOBENZAPRINE HCL 10 MG PO TABS: 10.0000 mg | ORAL_TABLET | Freq: Two times a day (BID) | ORAL | Status: DC | PRN

## 2012-02-17 NOTE — Telephone Encounter (Signed)
Pt came in the office today wanting a refill of flexeril. Pt states if the insurance will not cover # 180 then she is ok to get get #30 or #60 whichever the insurance will cover. Called and spoke with pharmacist and she states last time she received #180 with no problem. Refilled rx for #180 with no refills.

## 2012-03-01 ENCOUNTER — Ambulatory Visit (INDEPENDENT_AMBULATORY_CARE_PROVIDER_SITE_OTHER): Payer: Medicare Other

## 2012-03-01 ENCOUNTER — Encounter: Payer: Self-pay | Admitting: Family Medicine

## 2012-03-01 ENCOUNTER — Ambulatory Visit (INDEPENDENT_AMBULATORY_CARE_PROVIDER_SITE_OTHER): Payer: Medicare Other | Admitting: Family Medicine

## 2012-03-01 VITALS — BP 129/78 | HR 136 | Wt 190.0 lb

## 2012-03-01 DIAGNOSIS — R059 Cough, unspecified: Secondary | ICD-10-CM

## 2012-03-01 DIAGNOSIS — E039 Hypothyroidism, unspecified: Secondary | ICD-10-CM

## 2012-03-01 DIAGNOSIS — Q385 Congenital malformations of palate, not elsewhere classified: Secondary | ICD-10-CM

## 2012-03-01 DIAGNOSIS — R0989 Other specified symptoms and signs involving the circulatory and respiratory systems: Secondary | ICD-10-CM

## 2012-03-01 DIAGNOSIS — R0602 Shortness of breath: Secondary | ICD-10-CM

## 2012-03-01 DIAGNOSIS — R0982 Postnasal drip: Secondary | ICD-10-CM

## 2012-03-01 DIAGNOSIS — R42 Dizziness and giddiness: Secondary | ICD-10-CM

## 2012-03-01 DIAGNOSIS — E119 Type 2 diabetes mellitus without complications: Secondary | ICD-10-CM

## 2012-03-01 DIAGNOSIS — J3489 Other specified disorders of nose and nasal sinuses: Secondary | ICD-10-CM

## 2012-03-01 DIAGNOSIS — R05 Cough: Secondary | ICD-10-CM

## 2012-03-01 MED ORDER — LOSARTAN POTASSIUM 100 MG PO TABS: 100.0000 mg | ORAL_TABLET | Freq: Every day | ORAL | Status: DC

## 2012-03-01 NOTE — Progress Notes (Signed)
  Subjective:    Patient ID: Kiara Clayton, female    DOB: Jun 01, 1941, 71 y.o.   MRN: [id number]  HPI Says she has a tickle in her throat and then will start coughin  Comes and goes for several weeks. Says thought is originally thought was allergies or her thyorid.  Says a little dizzy when she first wakes up. Has been taking zyrtec QD for allergies.  She also complains of constant postnasal drip. 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 She denies any fever, facial pain or headaches. She has been feeling dizzy frequently.  No fever.   Hypertension-no chest pain or short of breath. She says she's been taking her medications regularly. But says she will stop them if her dizziness doesn't improve.   Review of Systems     Objective:   Physical Exam  Constitutional: She is oriented to person, place, and time. She appears well-developed and well-nourished.  HENT:  Head: Normocephalic and atraumatic.  Right Ear: External ear normal.  Left Ear: External ear normal.  Nose: Nose normal.  Mouth/Throat: Oropharynx is clear and moist.       TMs and canals are clear.  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   Eyes: Conjunctivae and EOM are normal. Pupils are equal, round, and reactive to light.  Neck: Neck supple. No thyromegaly present.  Cardiovascular: Normal rate, regular rhythm and normal heart sounds.   Pulmonary/Chest: Effort normal and breath sounds normal. She has no wheezes.  Lymphadenopathy:    She has no cervical adenopathy.  Neurological: She is alert and oriented to person, place, and time.  Skin: Skin is warm and dry.  Psychiatric: She has a normal mood and affect.          Assessment & Plan:  HTN - Welll controlled today.  She feels her dizziness is form her BP meds but she usually feels dizzy first thing in the morning before she actually takes her medication. Has had tried explained her that I really don't think it's medication related. I do think that  her cough could be related to her lisinopril. Will change to ARB. Followup in one month for repeat blood pressure check.  Cough probably ACE related. Will change to ARB.  Will get CXR to rule out nodule, Bronchitis, PNA.  Followup in one month to make sure that the cough has resolved.  Post nasal drip-unclear if this is related to her allergies and she started taking Zyrtec daily. Also consider subacute sinusitis. Like to get sinus CT can she's also been dizzy.

## 2012-03-12 LAB — TSH: TSH: 2.121 u[IU]/mL (ref 0.350–4.500)

## 2012-03-17 ENCOUNTER — Encounter: Payer: Self-pay | Admitting: Family Medicine

## 2012-03-17 ENCOUNTER — Ambulatory Visit (INDEPENDENT_AMBULATORY_CARE_PROVIDER_SITE_OTHER): Payer: Medicare Other | Admitting: Family Medicine

## 2012-03-17 VITALS — BP 145/78 | HR 116 | Wt 194.0 lb

## 2012-03-17 DIAGNOSIS — R7301 Impaired fasting glucose: Secondary | ICD-10-CM

## 2012-03-17 DIAGNOSIS — I10 Essential (primary) hypertension: Secondary | ICD-10-CM

## 2012-03-17 DIAGNOSIS — E785 Hyperlipidemia, unspecified: Secondary | ICD-10-CM

## 2012-03-17 NOTE — Progress Notes (Signed)
  Subjective:    Patient ID: Kiara Clayton, female    DOB: Feb 27, 1941, 71 y.o.   MRN: [id number]  HPI HTN- she never went to pick up her losartan. She still taking her old lisinopril. She still has the chronic cough. We did do a chest x-ray which was normal.  IFG - she's also here to discuss her blood work. Her A1c was 6.4. She does have a strong family history of diabetes. She admits she's been eating lots of sugary cough drops because of the cough.  Cough - has not improved at about the same. She is in a silly feel worse. No fever. She did not change the losartan.  Review of Systems     Objective:   Physical Exam  Constitutional: She is oriented to person, place, and time. She appears well-developed and well-nourished.  HENT:  Head: Normocephalic and atraumatic.  Cardiovascular: Normal rate, regular rhythm and normal heart sounds.   Pulmonary/Chest: Effort normal and breath sounds normal.  Neurological: She is alert and oriented to person, place, and time.  Skin: Skin is warm and dry.  Psychiatric: She has a normal mood and affect. Her behavior is normal.          Assessment & Plan:  HTN - Will change to the losratn and see if cough resolved. REcheck BP in one month  IFG - we discussed dietary changes including avoiding concentrated sweets and cutting back on her carbohydrate portion sizes. Recheck A1c in 3-4 months because she is very borderline today.  Cough - I still think this could be related to her ACE inhibitor. She will pick up the ARB sometime this week and started. I would like to see her back in one month.  Elevated calcium level-we will recheck a PTH with intact calcium today.  Hyperlipidemia-I reviewed her lab results with her. I still she really needs to be on a statin. She is very resistant at this time. I will send her LDL is much improved from a year ago so we'll continue to follow it. Did encourage more regular exercise and low-fat diet.

## 2012-03-18 ENCOUNTER — Encounter: Payer: Self-pay | Admitting: *Deleted

## 2012-03-18 LAB — PTH, INTACT AND CALCIUM: PTH: 33.4 pg/mL (ref 14.0–72.0)

## 2012-04-15 ENCOUNTER — Encounter: Payer: Self-pay | Admitting: Sports Medicine

## 2012-04-15 ENCOUNTER — Encounter: Payer: Self-pay | Admitting: Family Medicine

## 2012-04-15 ENCOUNTER — Ambulatory Visit (INDEPENDENT_AMBULATORY_CARE_PROVIDER_SITE_OTHER): Payer: Medicare Other | Admitting: Family Medicine

## 2012-04-15 ENCOUNTER — Ambulatory Visit (INDEPENDENT_AMBULATORY_CARE_PROVIDER_SITE_OTHER): Payer: Medicare Other | Admitting: Sports Medicine

## 2012-04-15 VITALS — BP 133/80 | HR 108 | Wt 197.0 lb

## 2012-04-15 DIAGNOSIS — I10 Essential (primary) hypertension: Secondary | ICD-10-CM

## 2012-04-15 DIAGNOSIS — Z23 Encounter for immunization: Secondary | ICD-10-CM

## 2012-04-15 DIAGNOSIS — M7061 Trochanteric bursitis, right hip: Secondary | ICD-10-CM | POA: Insufficient documentation

## 2012-04-15 DIAGNOSIS — R05 Cough: Secondary | ICD-10-CM

## 2012-04-15 DIAGNOSIS — E785 Hyperlipidemia, unspecified: Secondary | ICD-10-CM

## 2012-04-15 DIAGNOSIS — R059 Cough, unspecified: Secondary | ICD-10-CM

## 2012-04-15 DIAGNOSIS — Z1231 Encounter for screening mammogram for malignant neoplasm of breast: Secondary | ICD-10-CM

## 2012-04-15 DIAGNOSIS — M76899 Other specified enthesopathies of unspecified lower limb, excluding foot: Secondary | ICD-10-CM

## 2012-04-15 MED ORDER — LOSARTAN POTASSIUM 100 MG PO TABS: 100.0000 mg | ORAL_TABLET | Freq: Every day | ORAL | Status: DC

## 2012-04-15 NOTE — Addendum Note (Signed)
Addended by: Judie Petit A on: 04/15/2012 04:33 PM   Modules accepted: Orders

## 2012-04-15 NOTE — Progress Notes (Signed)
  Subjective:    Patient ID: Kiara Clayton, female    DOB: Oct 02, 1940, 71 y.o.   MRN: [id number]  HPI HTN - doing well. Taking meds.  Says still has a cough and lots of drianage. No CP or SOB.  Still feels stiff.  Tolerating new med well. Took last pill this AM.    Hyperlipidemia-her cholesterol was elevated on the last bloodwork that was improved. We're here to discuss his results as well.  She is still having cough and drainage and not sure why. No fevers. She thinks it could be from the losartan.   Review of Systems     Objective:   Physical Exam  Constitutional: She is oriented to person, place, and time. She appears well-developed and well-nourished.  HENT:  Head: Normocephalic and atraumatic.  Cardiovascular: Normal rate, regular rhythm and normal heart sounds.   Pulmonary/Chest: Effort normal and breath sounds normal.  Neurological: She is alert and oriented to person, place, and time.  Skin: Skin is warm and dry.  Psychiatric: She has a normal mood and affect. Her behavior is normal.          Assessment & Plan:  HTN-Doing well. REfills sent to the pharmacy.  Controlled. Blood pressures finally at goal. Followup in 4 months. Blood work is up-to-date.  Needs screening mammogram- will place order and she can schedule anytime.  Cough/drianage - will refer to ENT for further evaluation. I really do not think this is related to losartan.  Hyperlipidemia-we try to get in touch with her about her cholesterol. She says she wants to hold off on a cholesterol pill at this time. Her LDL was improved but still not at goal.  Flu Vaccine given today.

## 2012-04-15 NOTE — Progress Notes (Signed)
Subjective:    I'm seeing this patient as a consultation for:  Dr. Linford Arnold  CC: Right hip pain  HPI: Kiara Clayton is a very pleasant 71 year old female who comes in with a long history of pain that she localizes over right greater trochanter. She had an injection approximately 30 years ago, and notes that it lasted for a long time. She desires another. The pain is localized, does not radiate, is not associated with back pain, is not associated with any trauma. At this point, it is waking her from sleep.  Past medical history, Surgical history, Family history, Social history, Allergies, and medications have been entered into the medical record, reviewed, and no changes needed.   Review of Systems: No headache, visual changes, nausea, vomiting, diarrhea, constipation, dizziness, abdominal pain, skin rash, fevers, chills, night sweats, weight loss, body aches, joint swelling, muscle aches, chest pain, or shortness of breath.   Objective:   Vitals:  Afebrile, vital signs stable. General: Well Developed, well nourished, and in no acute distress.  Neuro/Psych: Alert and oriented x3, extra-ocular muscles intact, able to move all 4 extremities.  Skin: Warm and dry, no rashes noted.  Respiratory: Not using accessory muscles, speaking in full sentences, trachea midline.  Cardiovascular: Pulses palpable, no extremity edema. Abdomen: Does not appear distended. Right Hip: ROM IR: 45 Deg, ER: 45 Deg, Flexion: 120 Deg, Extension: 100 Deg, Abduction: 45 Deg, Adduction: 45 Deg Strength IR: 5/5, ER: 5/5, Flexion: 5/5, Extension: 5/5, Abduction: 4/5, Adduction: 5/5 Pelvic alignment unremarkable to inspection and palpation. Standing hip rotation and gait without trendelenburg sign / unsteadiness. Greater trochanter wi with significant tenderness to palpation No tenderness over piriformis and greater trochanter. No pain with FABER or FADIR. No SI joint tenderness and normal minimal SI movement.  Procedure:   Injection of right trochanteric bursa Consent obtained and verified. Time-out conducted. Noted no overlying erythema, induration, or other signs of local infection. Skin prepped in a sterile fashion. Topical analgesic spray: Ethyl chloride. Completed without difficulty. Meds: 1 cc Kenalog 40, 4 cc lidocaine. Pain immediately improved suggesting accurate placement of the medication. Advised to call if fevers/chills, erythema, induration, drainage, or persistent bleeding.    Impression and Recommendations:   This case required medical decision making of moderate complexity.

## 2012-04-15 NOTE — Assessment & Plan Note (Signed)
Injection as above. I would like her to do some formal physical therapy, work on her hip abductors.  Her abductors are very weak, and she does have Trendelenburg type gait because of this. 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

## 2012-04-19 ENCOUNTER — Ambulatory Visit (INDEPENDENT_AMBULATORY_CARE_PROVIDER_SITE_OTHER): Payer: Medicare Other | Admitting: Sports Medicine

## 2012-04-19 ENCOUNTER — Ambulatory Visit: Payer: Medicare Other | Admitting: Physical Therapy

## 2012-04-19 ENCOUNTER — Ambulatory Visit (INDEPENDENT_AMBULATORY_CARE_PROVIDER_SITE_OTHER): Payer: Medicare Other

## 2012-04-19 VITALS — BP 174/90 | HR 96 | Wt 194.0 lb

## 2012-04-19 DIAGNOSIS — Z1231 Encounter for screening mammogram for malignant neoplasm of breast: Secondary | ICD-10-CM

## 2012-04-19 DIAGNOSIS — M7061 Trochanteric bursitis, right hip: Secondary | ICD-10-CM

## 2012-04-19 DIAGNOSIS — M76899 Other specified enthesopathies of unspecified lower limb, excluding foot: Secondary | ICD-10-CM

## 2012-04-19 NOTE — Progress Notes (Signed)
Subjective:    CC: Hip pain  HPI: Kiara Clayton comes back to see me regarding an increase in amount of pain that she had the day after the injection. Last week, I injected her trochanteric bursa, pain resolved after injection. The next day she experienced an increasing amount of pain that lasted through the day, and was localized to the site of injection, and then spontaneously resolved, today she is completely pain-free, and just curious as to what this flare of pain was.  Past medical history, Surgical history, Family history, Social history, Allergies, and medications have been entered into the medical record, reviewed, and no changes needed.   Review of Systems: No fevers, chills, night sweats, weight loss, chest pain, or shortness of breath.   Objective:    General: Well Developed, well nourished, and in no acute distress.  Neuro: Alert and oriented x3, extra-ocular muscles intact.  HEENT: Normocephalic, atraumatic, pupils equal round reactive to light, neck supple, no masses. Skin: Warm and dry, no rashes. Right Hip: ROM Pelvic alignment unremarkable to inspection and palpation. Standing hip rotation and gait without trendelenburg sign / unsteadiness. Greater trochanter without tenderness to palpation. No tenderness over piriformis and greater trochanter. No pain with FABER or FADIR. No SI joint tenderness and normal minimal SI movement.   Impression and Recommendations:

## 2012-04-19 NOTE — Assessment & Plan Note (Addendum)
Pain is currently resolved, it seems as though she had a steroid flare today after the injection.  I gave her another copy of her rehabilitation handout. I again stressed the importance of doing physical therapy This has since resolved. She was reassured. She is still not done physical therapy, she will think about this, she can come back to see me on an as-needed basis

## 2012-07-15 ENCOUNTER — Ambulatory Visit (INDEPENDENT_AMBULATORY_CARE_PROVIDER_SITE_OTHER): Payer: Medicare Other

## 2012-07-15 ENCOUNTER — Encounter: Payer: Self-pay | Admitting: Physician Assistant

## 2012-07-15 ENCOUNTER — Ambulatory Visit (INDEPENDENT_AMBULATORY_CARE_PROVIDER_SITE_OTHER): Payer: Medicare Other | Admitting: Physician Assistant

## 2012-07-15 VITALS — BP 135/71 | HR 100 | Temp 97.8°F | Ht 66.0 in | Wt 196.0 lb

## 2012-07-15 DIAGNOSIS — R059 Cough, unspecified: Secondary | ICD-10-CM

## 2012-07-15 DIAGNOSIS — R0789 Other chest pain: Secondary | ICD-10-CM

## 2012-07-15 DIAGNOSIS — N644 Mastodynia: Secondary | ICD-10-CM

## 2012-07-15 DIAGNOSIS — M47814 Spondylosis without myelopathy or radiculopathy, thoracic region: Secondary | ICD-10-CM

## 2012-07-15 DIAGNOSIS — R05 Cough: Secondary | ICD-10-CM

## 2012-07-15 MED ORDER — CYCLOBENZAPRINE HCL 10 MG PO TABS: 10.0000 mg | ORAL_TABLET | Freq: Two times a day (BID) | ORAL | Status: DC | PRN

## 2012-07-15 MED ORDER — OMEPRAZOLE 40 MG PO CPDR: 40.0000 mg | DELAYED_RELEASE_CAPSULE | Freq: Every day | ORAL | Status: DC

## 2012-07-15 MED ORDER — NAPROXEN SODIUM 275 MG PO TABS: 275.0000 mg | ORAL_TABLET | Freq: Two times a day (BID) | ORAL | Status: DC

## 2012-07-15 NOTE — Patient Instructions (Addendum)
Increase Anaprox(naproxen sodium) to twice a day for next 2 weeks.Pt not sure if taking currently then start once a day then increase to twice a day if tolerated. Will give flexeril to try at night when pain is worse. Ice area before bedtime.   Will get chest xray.   Muscle Strain Muscle strain occurs when a muscle is stretched beyond its normal length. A small number of muscle fibers generally are torn. This is especially common in athletes. This happens when a sudden, violent force placed on a muscle stretches it too far. Usually, recovery from muscle strain takes 1 to 2 weeks. Complete healing will take 5 to 6 weeks.  HOME CARE INSTRUCTIONS   While awake, apply ice to the sore muscle for the first 2 days after the injury.  Put ice in a plastic bag.  Place a towel between your skin and the bag.  Leave the ice on for 15 to 20 minutes each hour.  Do not use the strained muscle for several days, until you no longer have pain.  You may wrap the injured area with an elastic bandage for comfort. Be careful not to wrap it too tightly. This may interfere with blood circulation or increase swelling.  Only take over-the-counter or prescription medicines for pain, discomfort, or fever as directed by your caregiver. SEEK MEDICAL CARE IF:  You have increasing pain or swelling in the injured area. MAKE SURE YOU:   Understand these instructions.  Will watch your condition.  Will get help right away if you are not doing well or get worse. Document Released: 07/06/2005 Document Revised: 09/28/2011 Document Reviewed: 07/18/2011 [facility] Patient Information 2013 [place], [place].

## 2012-07-15 NOTE — Progress Notes (Addendum)
  Subjective:    Patient ID: Kiara Clayton, female    DOB: Jan 22, 1941, 71 y.o.   MRN: [id number]  HPI Patient presents to the clinic with right sided breast pain for the past 2-3 weeks. Denies any warmth to the area and not tender to touch. Worst when moving. Denies any shortness of breath but has had an ongoing cough. This has always been a problem for her. Her cough was controlled once when she visited an allergist but that was many years ago. She has had acid reflux in the past but is not on anything currently and does not feel like she has any symptoms of acid reflux.She had a mammogram in September and was normal. Never felt like this before and has not tried anything to make better.   Review of Systems     Objective:   Physical Exam  Constitutional: She is oriented to person, place, and time. She appears well-developed and well-nourished.  HENT:  Head: Normocephalic and atraumatic.  Cardiovascular: Normal rate, regular rhythm and normal heart sounds.   Pulmonary/Chest: Effort normal and breath sounds normal. She has no wheezes.  Musculoskeletal:       Normal breast exam no masses or abnormalities found. No tenderness with palpitations. Nipple is not inverted and breast appear same size. NO lymphnodes were able to be palpitated in axillary region.  Neurological: She is alert and oriented to person, place, and time.  Skin: Skin is warm and dry. No rash noted.       NO skin changes when inspecting breast.  Psychiatric: She has a normal mood and affect. Her behavior is normal.          Assessment & Plan:  Right sided breast pain/cough- Normal exam today. Will get chest xray b/c patient request and to rule out any causes of cough that could be causing the pain. I suspect muscle strain and reassured pt based on exam was a strain or inflammation from coughing. Peak flow was done to further evaluate cough. Pt was in the green zone. Pt did not want to try nasal spray or anything for  cough. Discussed with her to change OTC antihistamine. Will refer to allergist again to see if they could work with her but pt wants to wait on referral. Did give acid reflux medication to try first to see if that helps with cough.I d refill NSAid to start using any to see if that would help with any inflammation. Warned pt that if that made chest tightness or acid refux symptoms worse to stop NSAIDS. Reassured pt that it may take some time. Encouraged her to use some of her flexeril at night for symptoms especially since worse at night and see if that helps.   CXR-NOrmal with no acute changes.pt notified. Try symptomatic care and follow up if not improving.

## 2012-07-18 ENCOUNTER — Ambulatory Visit: Payer: Medicare Other | Admitting: Sports Medicine

## 2012-10-19 ENCOUNTER — Encounter: Payer: Self-pay | Admitting: Family Medicine

## 2012-10-19 ENCOUNTER — Ambulatory Visit (INDEPENDENT_AMBULATORY_CARE_PROVIDER_SITE_OTHER): Payer: 59 | Admitting: Family Medicine

## 2012-10-19 VITALS — BP 141/77 | HR 92 | Temp 98.2°F | Wt 195.0 lb

## 2012-10-19 DIAGNOSIS — J45909 Unspecified asthma, uncomplicated: Secondary | ICD-10-CM

## 2012-10-19 DIAGNOSIS — J45901 Unspecified asthma with (acute) exacerbation: Secondary | ICD-10-CM

## 2012-10-19 MED ORDER — PREDNISONE 20 MG PO TABS: ORAL_TABLET | ORAL | Status: DC

## 2012-10-19 MED ORDER — IPRATROPIUM BROMIDE 0.02 % IN SOLN
0.5000 mg | Freq: Once | RESPIRATORY_TRACT | Status: AC
  Administered 2012-10-19: 0.5 mg via RESPIRATORY_TRACT

## 2012-10-19 MED ORDER — ALBUTEROL SULFATE HFA 108 (90 BASE) MCG/ACT IN AERS: 2.0000 | INHALATION_SPRAY | Freq: Four times a day (QID) | RESPIRATORY_TRACT | Status: DC | PRN

## 2012-10-19 MED ORDER — ALBUTEROL SULFATE (5 MG/ML) 0.5% IN NEBU
2.5000 mg | INHALATION_SOLUTION | Freq: Once | RESPIRATORY_TRACT | Status: AC
  Administered 2012-10-19: 2.5 mg via RESPIRATORY_TRACT

## 2012-10-19 MED ORDER — AZITHROMYCIN 250 MG PO TABS: ORAL_TABLET | ORAL | Status: DC

## 2012-10-19 NOTE — Progress Notes (Signed)
CC: Kiara Clayton is a 72 y.o. female is here for URI   Subjective: HPI:  Patient complains of one week of cough. It came on gradually 7 days ago, worsening on a daily basis, has tried NyQuil but did not help symptoms and has stopped due to side effects. No other interventions as of yet. Nothing particularly makes it better or worse. Described as a productive cough all hours of the day they do not wake her from sleep. Has been associated with a gagging sensation during coughing fits.  Describes it as a moderate severity.  Symptoms are worse with lying down. Over the past 3-4 days has been associated with clear nasal discharge and moderate subjective postnasal drip. She has albuterol at home but believes it's expired and has not used it. No peak flows outside report. She reports subjective fevers and chills over the past 2 days. She denies confusion, headache, mental disturbance, motor or sensory disturbances, chest pain, abdominal pain, nausea, shortness of breath, nor recent bleeding or bruising issues.   Review Of Systems Outlined In HPI  Past Medical History  Diagnosis Date  . Heart murmur   . Diphtheria      Family History  Problem Relation Age of Onset  . Alcohol abuse Sister   . Stomach cancer Maternal Aunt   . Brain cancer Mother   . Stroke Mother     deceased  . COPD Father   . Diabetes Mother   . Hypertension Mother      History  Substance Use Topics  . Smoking status: Former Smoker    Quit date: 07/20/1968  . Smokeless tobacco: Former Neurosurgeon    Quit date: 07/20/1968  . Alcohol Use: 0.5 oz/week    1 drink(s) per week     Objective: Filed Vitals:   10/19/12 0925  BP: 141/77  Pulse: 92  Temp: 98.2 F (36.8 C)    General: Alert and Oriented, No Acute Distress HEENT: Pupils equal, round, reactive to light. Conjunctivae clear.  External ears unremarkable, canals clear with intact TMs with appropriate landmarks.  Middle ear appears open without effusion. Pink  inferior turbinates.  Moist mucous membranes, pharynx with moderate cobblestoning.  Neck supple without palpable lymphadenopathy nor abnormal masses. Lungs: Comfortable work of breathing, mild to moderate diffuse rhonchi, mild end expiratory wheezing in all lung fields. No rales or signs of consolidation Cardiac: Regular rate and rhythm. Normal S1/S2.  No murmurs, rubs, nor gallops.   Extremities: No peripheral edema.  Strong peripheral pulses.  Mental Status: No depression, anxiety, nor agitation. Skin: Warm and dry.  Assessment & Plan: Kiara Clayton was seen today for uri.  Diagnoses and associated orders for this visit:  Asthma, chronic - albuterol (PROVENTIL) (5 MG/ML) 0.5% nebulizer solution 2.5 mg; Take 0.5 mLs (2.5 mg total) by nebulization once. - ipratropium (ATROVENT) nebulizer solution 0.5 mg; Take 2.5 mLs (0.5 mg total) by nebulization once.  Asthma with acute exacerbation - predniSONE (DELTASONE) 20 MG tablet; Three tabs at once daily for five days. - azithromycin (ZITHROMAX) 250 MG tablet; Take two tabs at once on day 1, then one tab daily on days 2-5. - albuterol (PROVENTIL HFA;VENTOLIN HFA) 108 (90 BASE) MCG/ACT inhaler; Inhale 2 puffs into the lungs every 6 (six) hours as needed for wheezing.    Asthma with acute exacerbation: Peak flow originally 300, improved to 350 following DuoNeb. Patient reports improvement in breathing comfort following DuoNeb. We'll start azithromycin and prednisone burst, albuterol sample given instructions to inhale 2  puffs every 4-6 hours for the next 48 hours then as needed. Signs and symptoms requring emergent/urgent reevaluation were discussed with the patient.  Return in about 1 week (around 10/26/2012).

## 2012-10-24 ENCOUNTER — Encounter: Payer: Self-pay | Admitting: Family Medicine

## 2012-10-24 ENCOUNTER — Ambulatory Visit (INDEPENDENT_AMBULATORY_CARE_PROVIDER_SITE_OTHER): Payer: 59 | Admitting: Family Medicine

## 2012-10-24 VITALS — BP 148/77 | HR 80 | Temp 98.0°F | Wt 200.0 lb

## 2012-10-24 DIAGNOSIS — J45901 Unspecified asthma with (acute) exacerbation: Secondary | ICD-10-CM

## 2012-10-24 DIAGNOSIS — A499 Bacterial infection, unspecified: Secondary | ICD-10-CM

## 2012-10-24 DIAGNOSIS — J329 Chronic sinusitis, unspecified: Secondary | ICD-10-CM

## 2012-10-24 MED ORDER — ALBUTEROL SULFATE HFA 108 (90 BASE) MCG/ACT IN AERS: 2.0000 | INHALATION_SPRAY | Freq: Four times a day (QID) | RESPIRATORY_TRACT | Status: DC | PRN

## 2012-10-24 MED ORDER — LEVOFLOXACIN 500 MG PO TABS: 500.0000 mg | ORAL_TABLET | Freq: Every day | ORAL | Status: AC

## 2012-10-24 MED ORDER — PREDNISONE 20 MG PO TABS: ORAL_TABLET | ORAL | Status: AC

## 2012-10-24 NOTE — Progress Notes (Signed)
CC: Kiara Clayton is a 72 y.o. female is here for Dizziness, Nasal Congestion and Cough   Subjective: HPI:  Patient complains of nasal congestion and facial pressure. This has been present for about a week now. Described as moderate in severity, pressure localized below the eyes.  Nothing makes above symptoms better or worse. It is present all hours of the day but not interfering with sleep.  She was started on azithromycin and a prednisone burst last week and denies any improvement while on the medication.  Complains of some mild dizziness that worsens with any deterioration in the nasal congestion.  She denies fevers, chills, motor sensory disturbances, vision loss, nor shortness of breath.  She complains of cough is described as nonproductive with some mild wheezing. Symptoms have mildly improved since starting azithromycin and prednisone last week. Symptoms seem to be worse when outside, improved with rest or sedentary activity. Is using albuterol with mild improvement of symptoms every 6 hours. Denies orthopnea, peripheral edema, abdominal pain nor bloating.   Review Of Systems Outlined In HPI  Past Medical History  Diagnosis Date  . Heart murmur   . Diphtheria      Family History  Problem Relation Age of Onset  . Alcohol abuse Sister   . Stomach cancer Maternal Aunt   . Brain cancer Mother   . Stroke Mother     deceased  . COPD Father   . Diabetes Mother   . Hypertension Mother      History  Substance Use Topics  . Smoking status: Former Smoker    Quit date: 07/20/1968  . Smokeless tobacco: Former Neurosurgeon    Quit date: 07/20/1968  . Alcohol Use: 0.5 oz/week    1 drink(s) per week     Objective: Filed Vitals:   10/24/12 1148  BP: 148/77  Pulse: 80  Temp: 98 F (36.7 C)    General: Alert and Oriented, No Acute Distress HEENT: Pupils equal, round, reactive to light. Conjunctivae clear.  External ears unremarkable, canals clear with intact TMs with appropriate  landmarks.  Middle ear effusion with serous appearance bilaterally.. Boggy and erythematous inferior turbinates with moderate mucoid discharge  Moist mucous membranes, pharynx without inflammation nor lesions.  Neck supple without palpable lymphadenopathy nor abnormal masses. Lungs: Comfortable work of breathing, mild end expiratory wheezing in all lung fields without rhonchi nor rales nor signs of consolidation Cardiac: Regular rate and rhythm. Normal S1/S2.  No murmurs, rubs, nor gallops.   Extremities: No peripheral edema.  Strong peripheral pulses.  Mental Status: No depression, anxiety, nor agitation. Skin: Warm and dry.  Assessment & Plan: [name] was seen today for dizziness, nasal congestion and cough.  Diagnoses and associated orders for this visit:  Bacterial sinusitis - predniSONE (DELTASONE) 20 MG tablet; Three tabs daily days 1-3, two tabs daily days 4-6, one tab daily days 7-9, half tab daily days 10-13. - levofloxacin (LEVAQUIN) 500 MG tablet; Take 1 tablet (500 mg total) by mouth daily.  Asthma exacerbation - predniSONE (DELTASONE) 20 MG tablet; Three tabs daily days 1-3, two tabs daily days 4-6, one tab daily days 7-9, half tab daily days 10-13. - levofloxacin (LEVAQUIN) 500 MG tablet; Take 1 tablet (500 mg total) by mouth daily.  Asthma with acute exacerbation - albuterol (PROVENTIL HFA;VENTOLIN HFA) 108 (90 BASE) MCG/ACT inhaler; Inhale 2 puffs into the lungs every 6 (six) hours as needed for wheezing.    Bacterial sinusitis: Allergic to penicillin, start Levaquin Asthma exacerbation: Peak flow 300  today ,Slightly improved since visit last week, will continue prednisone with taper antimicrobial coverage with Levaquin, scheduled albuterol 2 puffs every 4-6 hours for the next 48 hours then as needed.Signs and symptoms requring emergent/urgent reevaluation were discussed with the patient. Return Wednesday if lack of improvement, sooner if needed  Return if symptoms worsen or  fail to improve.

## 2012-11-10 ENCOUNTER — Encounter: Payer: Self-pay | Admitting: Family Medicine

## 2012-11-10 ENCOUNTER — Ambulatory Visit (INDEPENDENT_AMBULATORY_CARE_PROVIDER_SITE_OTHER): Payer: 59 | Admitting: Family Medicine

## 2012-11-10 VITALS — BP 186/70 | HR 79 | Temp 97.9°F | Wt 199.0 lb

## 2012-11-10 DIAGNOSIS — J302 Other seasonal allergic rhinitis: Secondary | ICD-10-CM

## 2012-11-10 DIAGNOSIS — J309 Allergic rhinitis, unspecified: Secondary | ICD-10-CM

## 2012-11-10 DIAGNOSIS — I10 Essential (primary) hypertension: Secondary | ICD-10-CM

## 2012-11-10 MED ORDER — MOMETASONE FUROATE 50 MCG/ACT NA SUSP: NASAL | Status: DC

## 2012-11-10 MED ORDER — CETIRIZINE HCL 10 MG PO TABS: 10.0000 mg | ORAL_TABLET | Freq: Every day | ORAL | Status: DC

## 2012-11-10 MED ORDER — LOSARTAN POTASSIUM 100 MG PO TABS: 100.0000 mg | ORAL_TABLET | Freq: Every day | ORAL | Status: DC

## 2012-11-10 NOTE — Progress Notes (Signed)
CC: Kiara Clayton is a 72 y.o. female is here for Cough and Dizziness   Subjective: HPI:  Patient complains of cough. She believes this is been present ever since the beginning of this month and is only slightly improved with 2 rounds of prednisone, initially azithromycin and then Levaquin. He improved after these regimens but does not seem to have gotten better since. She describes it as a dry cough with irritation in her upper trachea. It is worse when she takes her prosthesis out as this causes drainage in the back of the throat. Symptoms are worse when eating but last only for minutes. She denies choking or sensation of aspiration. She complains of nasal discharge but denies sinus pressure or ear pressure. She has occasional dizziness when lying down quickly that subsides within seconds. She's not taking any allergy medication nor anything over-the-counter. She denies fevers, chills, shortness of breath, productive cough, wheezing, chest pain  Patient reports she is no longer taking her Cozaar. She was hopeful that she doesn't need it anymore and decided to stop it is uncertain when this happened. She sure she's been off for a week. She denies chest pain, motor sensory disturbances, headaches, lightheadedness, orthopnea, peripheral edema    Review Of Systems Outlined In HPI  Past Medical History  Diagnosis Date  . Heart murmur   . Diphtheria      Family History  Problem Relation Age of Onset  . Alcohol abuse Sister   . Stomach cancer Maternal Aunt   . Brain cancer Mother   . Stroke Mother     deceased  . COPD Father   . Diabetes Mother   . Hypertension Mother      History  Substance Use Topics  . Smoking status: Former Smoker    Quit date: 07/20/1968  . Smokeless tobacco: Former Neurosurgeon    Quit date: 07/20/1968  . Alcohol Use: 0.5 oz/week    1 drink(s) per week     Objective: Filed Vitals:   11/10/12 0918  BP: 186/70  Pulse: 79  Temp: 97.9 F (36.6 C)     General: Alert and Oriented, No Acute Distress HEENT: Pupils equal, round, reactive to light. Conjunctivae clear.  External ears unremarkable, canals clear with intact TMs with appropriate landmarks.  Middle ear appears open without effusion. Boggy and erythematous inferior turbinates with mild mucoid discharge.  Moist mucous membranes, pharynx without inflammation nor lesions.  Neck supple without palpable lymphadenopathy nor abnormal masses. Moderate cobblestoning months prosthesis is out in posterior pharynx Lungs: Clear to auscultation bilaterally, no wheezing/ronchi/rales.  Comfortable work of breathing. Good air movement. Cardiac: Regular rate and rhythm. Normal S1/S2.  No murmurs, rubs, nor gallops.   Extremities: No peripheral edema.  Strong peripheral pulses.  Mental Status: No depression, anxiety, nor agitation. Skin: Warm and dry.  Assessment & Plan: Kiara Clayton was seen today for cough and dizziness.  Diagnoses and associated orders for this visit:  Essential hypertension, benign - losartan (COZAAR) 100 MG tablet; Take 1 tablet (100 mg total) by mouth daily.  Seasonal allergic rhinitis - mometasone (NASONEX) 50 MCG/ACT nasal spray; Two sprays in each nostril daily to prevent cough. - cetirizine (ZYRTEC) 10 MG tablet; Take 1 tablet (10 mg total) by mouth daily.  Other Orders - Cancel: albuterol (PROVENTIL HFA;VENTOLIN HFA) 108 (90 BASE) MCG/ACT inhaler; Inhale 2 puffs into the lungs every 6 (six) hours as needed for wheezing.    Peak expiratory flow above 350 today, she reports this is consistent with  values at home therefore doubt asthma exacerbation.  Seasonal rhinitis causing postnasal drip and cough therefore start sertraline and Nasonex on a daily basis.Signs and symptoms requring emergent/urgent reevaluation were discussed with the patient. Essential hypertension: Uncontrolled, restart losartan return in 1-2 weeks to followup with PCP for blood pressure control  Return  in about 2 weeks (around 11/24/2012).

## 2012-12-09 ENCOUNTER — Other Ambulatory Visit: Payer: Self-pay | Admitting: *Deleted

## 2012-12-09 MED ORDER — LEVOTHYROXINE SODIUM 88 MCG PO TABS: 88.0000 ug | ORAL_TABLET | Freq: Every day | ORAL | Status: DC

## 2012-12-14 ENCOUNTER — Other Ambulatory Visit: Payer: Self-pay | Admitting: Family Medicine

## 2012-12-14 ENCOUNTER — Ambulatory Visit (INDEPENDENT_AMBULATORY_CARE_PROVIDER_SITE_OTHER): Payer: 59 | Admitting: Family Medicine

## 2012-12-14 ENCOUNTER — Encounter: Payer: Self-pay | Admitting: Family Medicine

## 2012-12-14 VITALS — BP 122/64 | HR 77 | Ht 64.0 in | Wt 199.0 lb

## 2012-12-14 DIAGNOSIS — E785 Hyperlipidemia, unspecified: Secondary | ICD-10-CM

## 2012-12-14 DIAGNOSIS — I10 Essential (primary) hypertension: Secondary | ICD-10-CM

## 2012-12-14 DIAGNOSIS — R7301 Impaired fasting glucose: Secondary | ICD-10-CM

## 2012-12-14 DIAGNOSIS — E039 Hypothyroidism, unspecified: Secondary | ICD-10-CM

## 2012-12-14 LAB — COMPLETE METABOLIC PANEL WITH GFR
CO2: 27 mEq/L (ref 19–32)
GFR, Est African American: >89 mL/min
GFR, Est Non African American: 84 mL/min
Glucose, Bld: 112 mg/dL — ABNORMAL HIGH (ref 70–99)
Sodium: 142 mEq/L (ref 135–145)
Total Bilirubin: 0.4 mg/dL (ref 0.3–1.2)
Total Protein: 6.8 g/dL (ref 6.0–8.3)

## 2012-12-14 LAB — LIPID PANEL: HDL: 53 mg/dL (ref >39)

## 2012-12-14 LAB — HEMOGLOBIN A1C: Mean Plasma Glucose: 157 mg/dL — ABNORMAL HIGH (ref <117)

## 2012-12-14 MED ORDER — LEVOTHYROXINE SODIUM 100 MCG PO TABS: 100.0000 ug | ORAL_TABLET | Freq: Every day | ORAL | Status: DC

## 2012-12-14 NOTE — Patient Instructions (Signed)

## 2012-12-14 NOTE — Progress Notes (Signed)
Subjective:    Patient ID: Kiara Clayton, female    DOB: 1941/01/23, 72 y.o.   MRN: [id number]  HPI HTN-  Pt denies chest pain, SOB, dizziness, or heart palpitations.  Taking meds as directed w/o problems.  Denies medication side effects.  Occ dizzy when gets up from lying down.   Hyperlipidemia - Not on a medication. Doesn't want to be on a statin.  Says diet is fair, but not the best.    Hypothyroid- no skin or hair changes.  No weight changes.  Energy level is fair.   IFG - no increased thirst or urination.  Review of Systems     BP 122/64  Pulse 77  Ht 5\' 4"  (1.626 m)  Wt 199 lb (90.266 kg)  BMI 34.14 kg/m2    Allergies  Allergen Reactions  . Penicillins     Past Medical History  Diagnosis Date  . Heart murmur   . Diphtheria     Past Surgical History  Procedure Laterality Date  . Total abdominal hysterectomy w/ bilateral salpingoophorectomy  1988  . Lumbar disc surgery  1990    Dr. Kathrene Bongo, [facility]  . Breast biopsy  1988    Dr. Lurene Shadow, no cancer.   . Tonsillectomy and adenoidectomy      As a child   . Cleft palate repair  1970    [facility]    History   Social History  . Marital Status: Widowed    Spouse Name: N/A    Number of Children: 0  . Years of Education: N/A   Occupational History  . retired Medical laboratory scientific officer    Social History Main Topics  . Smoking status: Former Smoker    Quit date: 07/20/1968  . Smokeless tobacco: Former Neurosurgeon    Quit date: 07/20/1968  . Alcohol Use: 0.5 oz/week    1 drink(s) per week  . Drug Use: No  . Sexually Active: Not on file   Other Topics Concern  . Not on file   Social History Narrative   3 caffeinated drinks per day   No regular exericse.     Family History  Problem Relation Age of Onset  . Alcohol abuse Sister   . Stomach cancer Maternal Aunt   . Brain cancer Mother   . Stroke Mother     deceased  . COPD Father   . Diabetes Mother   . Hypertension Mother     Outpatient Encounter  Prescriptions as of 12/14/2012  Medication Sig Dispense Refill  . albuterol (PROVENTIL HFA;VENTOLIN HFA) 108 (90 BASE) MCG/ACT inhaler Inhale 2 puffs into the lungs every 6 (six) hours as needed for wheezing.  1 Inhaler  2  . CALCIUM PO Take 1,000 mg by mouth.      . cetirizine (ZYRTEC) 10 MG tablet Take 1 tablet (10 mg total) by mouth daily.  30 tablet  1  . cyclobenzaprine (FLEXERIL) 10 MG tablet Take 1 tablet (10 mg total) by mouth 2 (two) times daily as needed.  180 tablet  0  . levothyroxine (SYNTHROID, LEVOTHROID) 88 MCG tablet Take 1 tablet (88 mcg total) by mouth daily.  90 tablet  0  . losartan (COZAAR) 100 MG tablet Take 1 tablet (100 mg total) by mouth daily.  30 tablet  6  . Misc Natural Products (OSTEO BI-FLEX JOINT SHIELD PO) Take by mouth.      . mometasone (NASONEX) 50 MCG/ACT nasal spray Two sprays in each nostril daily to prevent cough.  17 g  2  . naproxen sodium (ANAPROX) 275 MG tablet Take 1 tablet (275 mg total) by mouth 2 (two) times daily with a meal. STart once a day and if tolerate can increase to twice a day.  60 tablet  1  . Selenium 200 MCG CAPS Take by mouth.       No facility-administered encounter medications on file as of 12/14/2012.       Objective:   Physical Exam  Constitutional: She is oriented to person, place, and time. She appears well-developed and well-nourished.  HENT:  Head: Normocephalic and atraumatic.  Cardiovascular: Normal rate, regular rhythm and normal heart sounds.   Pulmonary/Chest: Effort normal and breath sounds normal.  Neurological: She is alert and oriented to person, place, and time.  Skin: Skin is warm and dry.  Psychiatric: She has a normal mood and affect. Her behavior is normal.          Assessment & Plan:  HTN - Well controlled. F/U in 6 months.    Hyperlipidemia - Will recheck cholesterol today. She is fasting. She doesn't want to take to a statin.    Hypothyroid - Will recheck TSH. Will call with results.    IFG  - Due to recheck A1C today. Will call with results. Still work on low sugar and low carb diet.

## 2012-12-15 ENCOUNTER — Encounter: Payer: Self-pay | Admitting: *Deleted

## 2012-12-16 LAB — HEPATITIS PANEL, ACUTE
HCV Ab: NEGATIVE
Hepatitis B Surface Ag: NEGATIVE

## 2012-12-21 ENCOUNTER — Telehealth: Payer: Self-pay | Admitting: Family Medicine

## 2012-12-21 ENCOUNTER — Telehealth: Payer: Self-pay | Admitting: *Deleted

## 2012-12-21 DIAGNOSIS — E039 Hypothyroidism, unspecified: Secondary | ICD-10-CM

## 2012-12-21 NOTE — Telephone Encounter (Signed)
Lab order for tsh .Kiara Clayton

## 2012-12-21 NOTE — Telephone Encounter (Signed)
Patient walked-in and stated she received a letter that she needed a 3-4 wk re-check so she scheduled appt for 01/10/13 and she will go on June 16-20th to have labs done and request to have lab order sent down by Monday 01/02/13 so she can go down any day that week since she does not have a phone. Thanks

## 2012-12-21 NOTE — Telephone Encounter (Signed)
Order has been placed...Kiara Clayton   

## 2013-01-04 ENCOUNTER — Other Ambulatory Visit: Payer: Self-pay | Admitting: *Deleted

## 2013-01-04 ENCOUNTER — Telehealth: Payer: Self-pay | Admitting: *Deleted

## 2013-01-04 DIAGNOSIS — E039 Hypothyroidism, unspecified: Secondary | ICD-10-CM

## 2013-01-04 NOTE — Telephone Encounter (Signed)
Lab entered. Barry Dienes, LPN

## 2013-01-10 ENCOUNTER — Other Ambulatory Visit: Payer: Self-pay | Admitting: Family Medicine

## 2013-01-10 ENCOUNTER — Ambulatory Visit (INDEPENDENT_AMBULATORY_CARE_PROVIDER_SITE_OTHER): Payer: 59 | Admitting: Family Medicine

## 2013-01-10 ENCOUNTER — Encounter: Payer: Self-pay | Admitting: Family Medicine

## 2013-01-10 ENCOUNTER — Ambulatory Visit: Payer: 59

## 2013-01-10 ENCOUNTER — Ambulatory Visit (HOSPITAL_BASED_OUTPATIENT_CLINIC_OR_DEPARTMENT_OTHER): Admission: RE | Admit: 2013-01-10 | Payer: Medicare Other | Source: Ambulatory Visit

## 2013-01-10 ENCOUNTER — Ambulatory Visit (HOSPITAL_BASED_OUTPATIENT_CLINIC_OR_DEPARTMENT_OTHER)
Admission: RE | Admit: 2013-01-10 | Discharge: 2013-01-10 | Disposition: A | Discharge status: Home / Self Care | Payer: Medicare Other | Source: Ambulatory Visit | Attending: Family Medicine | Admitting: Family Medicine

## 2013-01-10 VITALS — BP 146/69 | HR 93 | Wt 203.0 lb

## 2013-01-10 DIAGNOSIS — R059 Cough, unspecified: Secondary | ICD-10-CM

## 2013-01-10 DIAGNOSIS — R0989 Other specified symptoms and signs involving the circulatory and respiratory systems: Secondary | ICD-10-CM

## 2013-01-10 DIAGNOSIS — R05 Cough: Secondary | ICD-10-CM

## 2013-01-10 DIAGNOSIS — E039 Hypothyroidism, unspecified: Secondary | ICD-10-CM

## 2013-01-10 DIAGNOSIS — R0602 Shortness of breath: Secondary | ICD-10-CM

## 2013-01-10 DIAGNOSIS — I10 Essential (primary) hypertension: Secondary | ICD-10-CM

## 2013-01-10 DIAGNOSIS — R062 Wheezing: Secondary | ICD-10-CM | POA: Insufficient documentation

## 2013-01-10 NOTE — Progress Notes (Signed)
Subjective:    Patient ID: Kiara Clayton, female    DOB: 1941-02-17, 72 y.o.   MRN: [id number]  HPI HTN - has gained some weight. Says she is not eating the best. No swelling.   Pt denies chest pain, SOB, dizziness, or heart palpitations.  Taking meds as directed w/o problems.  Denies medication side effects.    Hypothyroid - she had no complaints of skin or hair changes or weight changes at her last appointment but we did check her thyroid level and it was 4.1 which was slightly elevated.  Wants me to listen to her lungs today. Sys get tired easliy.  No fever or SOB.  She has been hearing gurgling in her stomach.  No heartburn or reflux.  She does feel that the quality her for her cough has changed recently. No fever or sore throat or ear pain or pressure. Her peak flow this morning was 410.  Review of Systems  BP 146/69  Pulse 93  Wt 203 lb (92.08 kg)  BMI 34.83 kg/m2  SpO2 97%    Allergies  Allergen Reactions  . Penicillins     Past Medical History  Diagnosis Date  . Heart murmur   . Diphtheria     Past Surgical History  Procedure Laterality Date  . Total abdominal hysterectomy w/ bilateral salpingoophorectomy  1988  . Lumbar disc surgery  1990    Dr. Kathrene Bongo, [facility]  . Breast biopsy  1988    Dr. Lurene Shadow, no cancer.   . Tonsillectomy and adenoidectomy      As a child   . Cleft palate repair  1970    [place] [facility]    History   Social History  . Marital Status: Widowed    Spouse Name: N/A    Number of Children: 0  . Years of Education: N/A   Occupational History  . retired Medical laboratory scientific officer    Social History Main Topics  . Smoking status: Former Smoker    Quit date: 07/20/1968  . Smokeless tobacco: Former Neurosurgeon    Quit date: 07/20/1968  . Alcohol Use: 0.5 oz/week    1 drink(s) per week  . Drug Use: No  . Sexually Active: Not on file   Other Topics Concern  . Not on file   Social History Narrative   3 caffeinated drinks per day   No regular  exericse.     Family History  Problem Relation Age of Onset  . Adopted: Yes  . Alcohol abuse Sister   . Stomach cancer Maternal Aunt   . Brain cancer Mother   . Stroke Mother     deceased  . COPD Father   . Diabetes Mother   . Hypertension Mother     Outpatient Encounter Prescriptions as of 01/10/2013  Medication Sig Dispense Refill  . albuterol (PROVENTIL HFA;VENTOLIN HFA) 108 (90 BASE) MCG/ACT inhaler Inhale 2 puffs into the lungs every 6 (six) hours as needed for wheezing.  1 Inhaler  2  . CALCIUM PO Take 1,000 mg by mouth.      . cetirizine (ZYRTEC) 10 MG tablet Take 1 tablet (10 mg total) by mouth daily.  30 tablet  1  . cyclobenzaprine (FLEXERIL) 10 MG tablet Take 1 tablet (10 mg total) by mouth 2 (two) times daily as needed.  180 tablet  0  . levothyroxine (SYNTHROID, LEVOTHROID) 100 MCG tablet Take 1 tablet (100 mcg total) by mouth daily.  90 tablet  0  . losartan (  COZAAR) 100 MG tablet Take 1 tablet (100 mg total) by mouth daily.  30 tablet  6  . Misc Natural Products (OSTEO BI-FLEX JOINT SHIELD PO) Take by mouth.      . mometasone (NASONEX) 50 MCG/ACT nasal spray Two sprays in each nostril daily to prevent cough.  17 g  2  . naproxen sodium (ANAPROX) 275 MG tablet Take 1 tablet (275 mg total) by mouth 2 (two) times daily with a meal. STart once a day and if tolerate can increase to twice a day.  60 tablet  1  . Selenium 200 MCG CAPS Take by mouth.       No facility-administered encounter medications on file as of 01/10/2013.          Objective:   Physical Exam  Constitutional: She is oriented to person, place, and time. She appears well-developed and well-nourished.  HENT:  Head: Normocephalic and atraumatic.  Right Ear: External ear normal.  Left Ear: External ear normal.  Nose: Nose normal.  Mouth/Throat: Oropharynx is clear and moist.  TMs and canals are clear.   Eyes: Conjunctivae and EOM are normal. Pupils are equal, round, and reactive to light.  Neck:  Neck supple. No thyromegaly present.  Cardiovascular: Normal rate, regular rhythm and normal heart sounds.   Pulmonary/Chest: Effort normal. She has wheezes.  Diffuse wheezing adn rhonchi  Lymphadenopathy:    She has no cervical adenopathy.  Neurological: She is alert and oriented to person, place, and time.  Skin: Skin is warm and dry.  Psychiatric: She has a normal mood and affect.          Assessment & Plan:  HTN - Up today but usually well controlled. Will recheck at followup.  Hypothyroid - reviewed her results with her today. Technically there in the normal range but on the high end of normal. The she is feeling more tired lately we could consider increasing her dose or continue current regimen and recheck her in 3-4 months instead of waiting a year.  Cough - She does have a chronic cough but has diffuse rhonchi today.  We'll get chest x-ray today to rule out infection or pneumonia. Chest x-ray was negative for bronchitis or pneumonia. Thus I gave her a sample of Dulera to try for the next 2 weeks to see if this improves her rhonchi and breathing. She does have a history of asthma.  Asthma-her peak flows at home seem reasonable but she clearly is having some wheezing and rhonchi on exam today with a normal chest x-ray. We'll start the wear and see how she does have the next 2 weeks.

## 2013-01-10 NOTE — Patient Instructions (Addendum)
Try the inhaler sample, Dulera.  2 puffs inhaled twice a day Follow up with me in 2 weeks to recheck your breathing and your blood pressure

## 2013-03-16 ENCOUNTER — Other Ambulatory Visit: Payer: Self-pay | Admitting: Family Medicine

## 2013-05-23 ENCOUNTER — Encounter: Payer: Self-pay | Admitting: Family Medicine

## 2013-05-23 ENCOUNTER — Ambulatory Visit (INDEPENDENT_AMBULATORY_CARE_PROVIDER_SITE_OTHER): Payer: Medicare Other | Admitting: Family Medicine

## 2013-05-23 VITALS — BP 144/83 | HR 87 | Wt 196.0 lb

## 2013-05-23 DIAGNOSIS — I10 Essential (primary) hypertension: Secondary | ICD-10-CM

## 2013-05-23 DIAGNOSIS — M069 Rheumatoid arthritis, unspecified: Secondary | ICD-10-CM

## 2013-05-23 DIAGNOSIS — E039 Hypothyroidism, unspecified: Secondary | ICD-10-CM

## 2013-05-23 DIAGNOSIS — E119 Type 2 diabetes mellitus without complications: Secondary | ICD-10-CM

## 2013-05-23 DIAGNOSIS — R7301 Impaired fasting glucose: Secondary | ICD-10-CM

## 2013-05-23 DIAGNOSIS — M2550 Pain in unspecified joint: Secondary | ICD-10-CM

## 2013-05-23 LAB — POCT GLYCOSYLATED HEMOGLOBIN (HGB A1C): Hemoglobin A1C: 5.7

## 2013-05-23 MED ORDER — LOSARTAN POTASSIUM 100 MG PO TABS: 100.0000 mg | ORAL_TABLET | Freq: Every day | ORAL | Status: DC

## 2013-05-23 MED ORDER — AMBULATORY NON FORMULARY MEDICATION: Status: DC

## 2013-05-23 NOTE — Progress Notes (Signed)
  Subjective:    Patient ID: Kiara Clayton, female    DOB: [birth year], 72 y.o.   MRN: [id number]  HPI HTN-  Pt denies chest pain, SOB, dizziness, or heart palpitations.  Taking meds as directed w/o problems.  Denies medication side effects.    DM-Has noticed inc urination over the last 2-3 mo esp at night.  No inc thrist.  No wounds that aren't healing well.  Says still having pain in her hands.   Hypothyroid - Doing well on the thyroid medication.  We increased her dose approximately 4 months ago. She's due for repeat check today.  She has lost 7 pounds since increasing her dose.  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 She says that they do hurt at night and so she wears gloves. If she wears gloves actually feel little bit better.  Review of Systems     Objective:   Physical Exam  Constitutional: She is oriented to person, place, and time. She appears well-developed and well-nourished.  HENT:  Head: Normocephalic and atraumatic.  Cardiovascular: Normal rate, regular rhythm and normal heart sounds.   Pulmonary/Chest: Effort normal and breath sounds normal.  Musculoskeletal:  Some deformity of the PIPs and MCPs. She does have some tenderness with squeeze test. No bogginess or redness of the joints. She does have a little bit deformity at some of the DIPs.  Neurological: She is alert and oriented to person, place, and time.  Skin: Skin is warm and dry.  Psychiatric: She has a normal mood and affect. Her behavior is normal.          Assessment & Plan:  HTN- Well controlled today. Continue current regimen. Refills sent to pharmacy. Followup in 6 months.  DM - last A1c was 7.1. We will get a prescription for a glucose meter to monitor her sugars a couple times a week first thing in the morning before breakfast. We discussed that she may need medication we will recheck her A1c today with her weight loss to see if it has improved.  Her A1c was 5.7 which is fantastic. We'll continue to monitor every 6 months.  Hypothyroid - recheck TSH on higher dose of med.   RA- dos have RA. Hasn't seen her rhuematologist in over a year. Off of DMARDs. Encuraged her to f/u with her rheum.

## 2013-05-24 ENCOUNTER — Encounter: Payer: Self-pay | Admitting: Family Medicine

## 2013-05-24 LAB — TSH: TSH: 22.431 u[IU]/mL — ABNORMAL HIGH (ref 0.350–4.500)

## 2013-05-25 ENCOUNTER — Other Ambulatory Visit: Payer: Self-pay | Admitting: Family Medicine

## 2013-06-08 ENCOUNTER — Other Ambulatory Visit: Payer: Self-pay | Admitting: Family Medicine

## 2013-06-08 DIAGNOSIS — Z1231 Encounter for screening mammogram for malignant neoplasm of breast: Secondary | ICD-10-CM

## 2013-06-13 ENCOUNTER — Ambulatory Visit (INDEPENDENT_AMBULATORY_CARE_PROVIDER_SITE_OTHER): Payer: Medicare Other

## 2013-06-13 DIAGNOSIS — Z1231 Encounter for screening mammogram for malignant neoplasm of breast: Secondary | ICD-10-CM

## 2013-06-14 ENCOUNTER — Encounter: Payer: Self-pay | Admitting: *Deleted

## 2013-08-25 ENCOUNTER — Ambulatory Visit (INDEPENDENT_AMBULATORY_CARE_PROVIDER_SITE_OTHER): Payer: Medicare Other | Admitting: Sports Medicine

## 2013-08-25 ENCOUNTER — Other Ambulatory Visit: Payer: Self-pay | Admitting: *Deleted

## 2013-08-25 ENCOUNTER — Ambulatory Visit: Payer: Medicare Other | Admitting: Family Medicine

## 2013-08-25 ENCOUNTER — Encounter: Payer: Self-pay | Admitting: Sports Medicine

## 2013-08-25 VITALS — BP 145/75 | HR 71 | Ht 65.0 in | Wt 195.0 lb

## 2013-08-25 DIAGNOSIS — M76899 Other specified enthesopathies of unspecified lower limb, excluding foot: Secondary | ICD-10-CM

## 2013-08-25 DIAGNOSIS — M7061 Trochanteric bursitis, right hip: Secondary | ICD-10-CM

## 2013-08-25 DIAGNOSIS — S0083XA Contusion of other part of head, initial encounter: Secondary | ICD-10-CM | POA: Insufficient documentation

## 2013-08-25 DIAGNOSIS — S0003XA Contusion of scalp, initial encounter: Secondary | ICD-10-CM

## 2013-08-25 DIAGNOSIS — S1093XA Contusion of unspecified part of neck, initial encounter: Secondary | ICD-10-CM

## 2013-08-25 DIAGNOSIS — I10 Essential (primary) hypertension: Secondary | ICD-10-CM

## 2013-08-25 MED ORDER — LEVOTHYROXINE SODIUM 100 MCG PO TABS: ORAL_TABLET | ORAL | Status: DC

## 2013-08-25 MED ORDER — LOSARTAN POTASSIUM 100 MG PO TABS: 100.0000 mg | ORAL_TABLET | Freq: Every day | ORAL | Status: DC

## 2013-08-25 NOTE — Progress Notes (Signed)
  Subjective:    CC: Fall  HPI: This is an extremely pleasant 73 year old female who took a fall earlier today, she had the left side of her forehead, and developed immediate swelling. She had no loss of consciousness, no visual changes, no current headaches, no numbness or tingling on either side of the body, no nausea. No difficulties with balance.  Past medical history, Surgical history, Family history not pertinant except as noted below, Social history, Allergies, and medications have been entered into the medical record, reviewed, and no changes needed.   Review of Systems: No fevers, chills, night sweats, weight loss, chest pain, or shortness of breath.   Objective:    General: Well Developed, well nourished, and in no acute distress.  Neuro: Alert and oriented x3, extra-ocular muscles intact, sensation grossly intact. Cranial nerves II through XII are intact, motor, sensory, and coordinative functions are all intact.  HEENT: Normocephalic, atraumatic, pupils equal round reactive to light, neck supple, no masses, no lymphadenopathy, thyroid nonpalpable.  Skin: Warm and dry, no rashes.There is a 4 cm hematoma over the left forehead, this is nontender, there is no overlying laceration, only a mild abrasion which is clean.  Cardiac: Regular rate and rhythm, no murmurs rubs or gallops, no lower extremity edema.  Respiratory: Clear to auscultation bilaterally. Not using accessory muscles, speaking in full sentences.  Procedure: Real-time Ultrasound Guided aspiration of left forehead hematoma Device: GE Logiq E  Verbal informed consent obtained.  Time-out conducted.  Noted no overlying erythema, induration, or other signs of local infection.  Skin prepped in a sterile fashion.  Local anesthesia: Topical Ethyl chloride.  With sterile technique and under real time ultrasound guidance:  Noted hypoechoic substance in the hematoma, an 18-gauge needle was advanced into the hematoma attempted  aspiration yielded only very little fluid. Completed without difficulty  Pain immediately resolved suggesting accurate placement of the medication.  Advised to call if fevers/chills, erythema, induration, drainage, or persistent bleeding.  Images permanently stored and available for review in the ultrasound unit.  Impression: Technically successful ultrasound guided aspiration of hematoma.  The forehead was strapped with compressive dressing.  Impression and Recommendations:

## 2013-08-25 NOTE — Assessment & Plan Note (Signed)
Continued to be pain-free after injection.

## 2013-08-25 NOTE — Assessment & Plan Note (Signed)
Aspiration as above. Compression wrap applied. Return as needed, topical icing.

## 2013-09-05 ENCOUNTER — Ambulatory Visit: Payer: Medicare Other | Admitting: Family Medicine

## 2013-09-06 ENCOUNTER — Ambulatory Visit (INDEPENDENT_AMBULATORY_CARE_PROVIDER_SITE_OTHER): Payer: Medicare Other | Admitting: Family Medicine

## 2013-09-06 ENCOUNTER — Encounter: Payer: Self-pay | Admitting: Family Medicine

## 2013-09-06 VITALS — BP 142/74 | HR 105 | Wt 192.0 lb

## 2013-09-06 DIAGNOSIS — R7301 Impaired fasting glucose: Secondary | ICD-10-CM

## 2013-09-06 DIAGNOSIS — IMO0001 Reserved for inherently not codable concepts without codable children: Secondary | ICD-10-CM

## 2013-09-06 DIAGNOSIS — E039 Hypothyroidism, unspecified: Secondary | ICD-10-CM

## 2013-09-06 DIAGNOSIS — M797 Fibromyalgia: Secondary | ICD-10-CM

## 2013-09-06 LAB — POCT GLYCOSYLATED HEMOGLOBIN (HGB A1C): HEMOGLOBIN A1C: 5.6

## 2013-09-06 MED ORDER — CYCLOBENZAPRINE HCL 10 MG PO TABS: 10.0000 mg | ORAL_TABLET | Freq: Two times a day (BID) | ORAL | Status: DC | PRN

## 2013-09-06 NOTE — Progress Notes (Signed)
CC: Kiara Clayton is a 73 y.o. female is here for f/u IFG and would like refill of muscle relaxer   Subjective: HPI:  Followup impaired fasting glucose: Since we saw her last she has seen a dietitian and has spaced out her meals throughout the day instead of one to 2 large meals during the day. She has lost approximately 3 pounds since we saw her last. No formal exercise routine is quite picky about cutting down on starches in her diet. Denies polyuria polyphagia polydipsia nor vision loss  Followup hyperthyroidism: TSH was elevated at her last check she does not believe there was a change made to her levothyroxine. She continues to take 100 mcg on a daily basis without missed doses. Denies GI disturbance unintentional weight loss or gain nor mental disturbance.  She is requesting a refill on cyclobenzaprine which has been used for diffuse muscle pain for matter of years now. She takes one-2 a day and has complete resolution of her mild to moderate muscle belly soreness in the upper and lower extremities. Denies any weakness or skin changes overlying the site of discomfort   Review Of Systems Outlined In HPI  Past Medical History  Diagnosis Date  . Heart murmur   . Diphtheria     Past Surgical History  Procedure Laterality Date  . Total abdominal hysterectomy w/ bilateral salpingoophorectomy  1988  . Lumbar disc surgery  1990    Dr. Verdene Lennert, [place]  . Breast biopsy  1988    Dr. Bubba Camp, no cancer.   . Tonsillectomy and adenoidectomy      As a child   . Cleft palate repair  1970    [place]   Family History  Problem Relation Age of Onset  . Adopted: Yes  . Alcohol abuse Sister   . Stomach cancer Maternal Aunt   . Brain cancer Mother   . Stroke Mother     deceased  . COPD Father   . Diabetes Mother   . Hypertension Mother     History   Social History  . Marital Status: Widowed    Spouse Name: N/A    Number of Children: 0  . Years of Education: N/A    Occupational History  . retired Production manager    Social History Main Topics  . Smoking status: Former Smoker    Quit date: 07/20/1968  . Smokeless tobacco: Former Systems developer    Quit date: 07/20/1968  . Alcohol Use: 0.5 oz/week    1 drink(s) per week  . Drug Use: No  . Sexual Activity: Not on file   Other Topics Concern  . Not on file   Social History Narrative   3 caffeinated drinks per day   No regular exericse.      Objective: BP 142/74  Pulse 105  Wt 192 lb (87.091 kg)  General: Alert and Oriented, No Acute Distress HEENT: Pupils equal, round, reactive to light. Conjunctivae clear.  Moist mucous membranes pharynx unremarkable with prosthesis in place. No palpable thyromegaly Lungs: Clear to auscultation bilaterally, no wheezing/ronchi/rales.  Comfortable work of breathing. Good air movement. Cardiac: Regular rate and rhythm. Normal S1/S2.  No murmurs, rubs, nor gallops.   Extremities: No peripheral edema.  Strong peripheral pulses.  Mental Status: No depression, anxiety, nor agitation. Skin: Warm and dry.  Assessment & Plan: Kiara Clayton was seen today for f/u ifg and would like refill of muscle relaxer.  Diagnoses and associated orders for this visit:  IFG (impaired  fasting glucose) - POCT HgB A1C  Hypothyroid - TSH  Fibromyalgia  Other Orders - cyclobenzaprine (FLEXERIL) 10 MG tablet; Take 1 tablet (10 mg total) by mouth 2 (two) times daily as needed.    Impaired fasting glucose: Controlled continue dietary interventions congratulated her success Hypothyroidism: Uncontrolled based on most recent TSH rechecking this today and will adjust levothyroxine if needed Fibromyalgia: controlled refill flexeril   Return in about 3 months (around 12/04/2013).

## 2013-09-07 LAB — TSH: TSH: 17.364 u[IU]/mL — ABNORMAL HIGH (ref 0.350–4.500)

## 2013-09-08 ENCOUNTER — Telehealth: Payer: Self-pay | Admitting: Family Medicine

## 2013-09-08 MED ORDER — LEVOTHYROXINE SODIUM 112 MCG PO TABS: ORAL_TABLET | ORAL | Status: DC

## 2013-09-08 NOTE — Telephone Encounter (Signed)
Letter mailed

## 2013-09-08 NOTE — Telephone Encounter (Signed)
Seth Bake Can you please contact patient by phone(i don't think she has one) or mail that her thyroid dose was to low, I'd encourage her to increase levothyroxine to 169mcg, which I've sent to her [facility], follow up 3 months.

## 2013-09-18 ENCOUNTER — Encounter: Payer: Self-pay | Admitting: Family Medicine

## 2013-09-28 ENCOUNTER — Ambulatory Visit (INDEPENDENT_AMBULATORY_CARE_PROVIDER_SITE_OTHER): Payer: Medicare Other | Admitting: Family Medicine

## 2013-09-28 ENCOUNTER — Encounter: Payer: Self-pay | Admitting: Family Medicine

## 2013-09-28 VITALS — BP 145/76 | HR 81 | Temp 97.4°F | Resp 16 | Wt 194.8 lb

## 2013-09-28 DIAGNOSIS — I10 Essential (primary) hypertension: Secondary | ICD-10-CM

## 2013-09-28 DIAGNOSIS — E039 Hypothyroidism, unspecified: Secondary | ICD-10-CM

## 2013-09-28 NOTE — Progress Notes (Signed)
   Subjective:    Patient ID: Kiara Clayton, female    DOB: 02-22-1941, 73 y.o.   MRN: [id number]  HPI Hypothyroid - Started her new thyroid medication about a week ago.  No problems on the new dose.  No skin or hair or weight changes.    Hypertension- Pt denies chest pain, SOB, dizziness, or heart palpitations. She stopped her BP pills about 1 week ago    Review of Systems     Objective:   Physical Exam  Constitutional: She is oriented to person, place, and time. She appears well-developed and well-nourished.  HENT:  Head: Normocephalic and atraumatic.  Neck: Neck supple. No thyromegaly present.  Cardiovascular: Normal rate, regular rhythm and normal heart sounds.   Pulmonary/Chest: Effort normal and breath sounds normal.  Lymphadenopathy:    She has no cervical adenopathy.  Neurological: She is alert and oriented to person, place, and time.  Skin: Skin is warm and dry.  Psychiatric: She has a normal mood and affect. Her behavior is normal.          Assessment & Plan:  Hypothyroid - REchec TSH next week. Will adjust dose if needed.   HTN - Well controlled.  F/U in 6 months.   She is off med. Will monitor off of med. New BP goal of 150/90.

## 2013-10-02 ENCOUNTER — Telehealth: Payer: Self-pay | Admitting: Family Medicine

## 2013-10-02 NOTE — Telephone Encounter (Signed)
Patient walkedin advised that she just had blood work done by labs thyroid and req to have the results mailed to her addr. Thanks

## 2013-10-03 ENCOUNTER — Other Ambulatory Visit: Payer: Self-pay | Admitting: Family Medicine

## 2013-10-03 LAB — TSH: TSH: 9.193 u[IU]/mL — ABNORMAL HIGH (ref 0.350–4.500)

## 2013-10-03 MED ORDER — LEVOTHYROXINE SODIUM 125 MCG PO TABS: ORAL_TABLET | ORAL | Status: DC

## 2013-10-03 NOTE — Telephone Encounter (Signed)
Pt's results mailed.Kiara Clayton Lynetta  

## 2013-10-04 ENCOUNTER — Ambulatory Visit (INDEPENDENT_AMBULATORY_CARE_PROVIDER_SITE_OTHER): Payer: Medicare Other | Admitting: Sports Medicine

## 2013-10-04 ENCOUNTER — Encounter: Payer: Self-pay | Admitting: Sports Medicine

## 2013-10-04 VITALS — BP 126/70 | HR 94 | Temp 98.7°F | Ht 65.0 in | Wt 192.0 lb

## 2013-10-04 DIAGNOSIS — E876 Hypokalemia: Secondary | ICD-10-CM

## 2013-10-04 DIAGNOSIS — S0083XA Contusion of other part of head, initial encounter: Secondary | ICD-10-CM

## 2013-10-04 DIAGNOSIS — R079 Chest pain, unspecified: Secondary | ICD-10-CM | POA: Insufficient documentation

## 2013-10-04 DIAGNOSIS — E039 Hypothyroidism, unspecified: Secondary | ICD-10-CM

## 2013-10-04 MED ORDER — NITROGLYCERIN 0.4 MG SL SUBL
0.4000 mg | SUBLINGUAL_TABLET | SUBLINGUAL | Status: DC | PRN
Start: 2013-10-04 — End: 2014-03-29

## 2013-10-04 MED ORDER — OMEPRAZOLE 40 MG PO CPDR: 40.0000 mg | DELAYED_RELEASE_CAPSULE | Freq: Two times a day (BID) | ORAL | Status: DC

## 2013-10-04 NOTE — Progress Notes (Signed)
  Subjective:    CC: Chest pain  HPI: Kiara Clayton is a 73 year old female, for the past several weeks she has had pain which localizes in the left side of her chest, described as burning without radiation, worse when laying flat. It is nonexertional, is not relieved by rest, not worsened by stress. No associated nausea, diaphoresis, palpitations, or presyncope. Symptoms are mild, intermittent.  Past medical history, Surgical history, Family history not pertinant except as noted below, Social history, Allergies, and medications have been entered into the medical record, reviewed, and no changes needed.   Review of Systems: No fevers, chills, night sweats, weight loss, chest pain, or shortness of breath.   Objective:    General: Well Developed, well nourished, and in no acute distress.  Neuro: Alert and oriented x3, extra-ocular muscles intact, sensation grossly intact.  HEENT: Normocephalic, atraumatic, pupils equal round reactive to light, neck supple, no masses, no lymphadenopathy, thyroid nonpalpable.  Skin: Warm and dry, no rashes. Cardiac: Regular rate and rhythm, no murmurs rubs or gallops, no lower extremity edema.  Respiratory: Clear to auscultation bilaterally. Not using accessory muscles, speaking in full sentences. Chest is nontender to palpation.  Impression and Recommendations:

## 2013-10-04 NOTE — Assessment & Plan Note (Signed)
Symptoms do sound predominantly noncardiac, some more esophageal. Starting omeprazole 40 twice a day. Checking some blood work, I don't think she needs any emergent evaluation, I would like to send her for cardiology for consideration of stress testing to completely rule out any possible cardiac cause of his chest pain. She will also get some nitroglycerin to take as needed to assist in our diagnoses.

## 2013-10-04 NOTE — Assessment & Plan Note (Signed)
Resolved

## 2013-10-05 DIAGNOSIS — E876 Hypokalemia: Secondary | ICD-10-CM | POA: Insufficient documentation

## 2013-10-05 LAB — CBC
HCT: 36 % (ref 36.0–46.0)
Hemoglobin: 12.9 g/dL (ref 12.0–15.0)
MCH: 32.1 pg (ref 26.0–34.0)
MCHC: 35.8 g/dL (ref 30.0–36.0)
MCV: 89.6 fL (ref 78.0–100.0)
Platelets: 214 K/uL (ref 150–400)
RBC: 4.02 MIL/uL (ref 3.87–5.11)
RDW: 13.9 % (ref 11.5–15.5)
WBC: 12.7 K/uL — ABNORMAL HIGH (ref 4.0–10.5)

## 2013-10-05 LAB — COMPREHENSIVE METABOLIC PANEL
AST: 68 U/L — ABNORMAL HIGH (ref 0–37)
Albumin: 3.6 g/dL (ref 3.5–5.2)
Alkaline Phosphatase: 68 U/L (ref 39–117)
BUN: 13 mg/dL (ref 6–23)
Glucose, Bld: 102 mg/dL — ABNORMAL HIGH (ref 70–99)
Potassium: 2.9 mEq/L — ABNORMAL LOW (ref 3.5–5.3)
Sodium: 138 mEq/L (ref 135–145)
Total Bilirubin: 1 mg/dL (ref 0.2–1.2)

## 2013-10-05 LAB — LIPID PANEL
Cholesterol: 142 mg/dL (ref 0–200)
HDL: 38 mg/dL — ABNORMAL LOW (ref >39)
LDL Cholesterol: 78 mg/dL (ref 0–99)
Total CHOL/HDL Ratio: 3.7 Ratio
Triglycerides: 129 mg/dL (ref <150)
VLDL: 26 mg/dL (ref 0–40)

## 2013-10-05 LAB — TSH: TSH: 9.961 u[IU]/mL — ABNORMAL HIGH (ref 0.350–4.500)

## 2013-10-05 LAB — COMPREHENSIVE METABOLIC PANEL WITH GFR
ALT: 142 U/L — ABNORMAL HIGH (ref 0–35)
CO2: 24 meq/L (ref 19–32)
Calcium: 9.5 mg/dL (ref 8.4–10.5)
Chloride: 102 meq/L (ref 96–112)
Creat: 0.58 mg/dL (ref 0.50–1.10)
Total Protein: 6.5 g/dL (ref 6.0–8.3)

## 2013-10-05 LAB — LIPASE: Lipase: 21 U/L (ref 0–75)

## 2013-10-05 MED ORDER — POTASSIUM CHLORIDE CRYS ER 20 MEQ PO TBCR: 40.0000 meq | EXTENDED_RELEASE_TABLET | Freq: Two times a day (BID) | ORAL | Status: DC

## 2013-10-05 MED ORDER — LEVOTHYROXINE SODIUM 137 MCG PO TABS: ORAL_TABLET | ORAL | Status: DC

## 2013-10-05 NOTE — Assessment & Plan Note (Signed)
Increasing levothyroxine 137

## 2013-10-05 NOTE — Addendum Note (Signed)
Addended by: Silverio Decamp on: 10/05/2013 08:39 AM   Modules accepted: Orders

## 2013-10-05 NOTE — Assessment & Plan Note (Signed)
Adding potassium supplementation, needs a recheck next week.

## 2013-10-18 ENCOUNTER — Encounter: Payer: Self-pay | Admitting: Family Medicine

## 2013-10-18 ENCOUNTER — Ambulatory Visit (INDEPENDENT_AMBULATORY_CARE_PROVIDER_SITE_OTHER): Payer: Medicare Other | Admitting: Family Medicine

## 2013-10-18 ENCOUNTER — Other Ambulatory Visit: Payer: Self-pay | Admitting: Family Medicine

## 2013-10-18 VITALS — BP 146/79 | HR 80 | Ht 65.0 in | Wt 195.0 lb

## 2013-10-18 DIAGNOSIS — E039 Hypothyroidism, unspecified: Secondary | ICD-10-CM

## 2013-10-18 DIAGNOSIS — I10 Essential (primary) hypertension: Secondary | ICD-10-CM

## 2013-10-18 DIAGNOSIS — E876 Hypokalemia: Secondary | ICD-10-CM

## 2013-10-18 NOTE — Patient Instructions (Signed)
Reheck thyroid in one month.  Take the lab slip before.   Call if ankle swelling gets worse then let us know.

## 2013-10-18 NOTE — Progress Notes (Signed)
   Subjective:    Patient ID: Kiara Clayton, female    DOB: 1940-10-09, 73 y.o.   MRN: [id number]  HPI Hypothyroid - her TSH was elevated and we had to increase her medication she did pick it up and has been on it for 10 days. Still having some ankle swelling but says is better.  Lab Results  Component Value Date   TSH 9.961* 10/04/2013   Hypokalemia - She is taking her potassium and inc potassium in her diet. No change in sxs.    Review of Systems     Objective:   Physical Exam  Constitutional: She is oriented to person, place, and time. She appears well-developed and well-nourished.  HENT:  Head: Normocephalic and atraumatic.  Neck: Neck supple. No thyromegaly present.  Cardiovascular: Normal rate, regular rhythm and normal heart sounds.   Pulmonary/Chest: Effort normal and breath sounds normal.  Lymphadenopathy:    She has no cervical adenopathy.  Neurological: She is alert and oriented to person, place, and time.  Skin: Skin is warm and dry.  Psychiatric: She has a normal mood and affect. Her behavior is normal.          Assessment & Plan:  Hypothyroid - Will recheck level in 4 weeks. She is doing well on the new dose and ankle swelling is a littleb better. Exam is normal today  Hypokalemia-she has increase potassium in her diet. She is taking a supplement. She's not any side effects or problems so far. We will recheck this level in one month as well.

## 2013-10-31 ENCOUNTER — Telehealth: Payer: Self-pay | Admitting: Family Medicine

## 2013-10-31 LAB — BASIC METABOLIC PANEL WITH GFR
BUN: 15 mg/dL (ref 6–23)
CHLORIDE: 104 meq/L (ref 96–112)
CO2: 26 mEq/L (ref 19–32)
Calcium: 10 mg/dL (ref 8.4–10.5)
Creat: 0.74 mg/dL (ref 0.50–1.10)
GFR, EST NON AFRICAN AMERICAN: 82 mL/min
GFR, Est African American: >89 mL/min
Glucose, Bld: 143 mg/dL — ABNORMAL HIGH (ref 70–99)
POTASSIUM: 4 meq/L (ref 3.5–5.3)
Sodium: 140 mEq/L (ref 135–145)

## 2013-10-31 LAB — TSH: TSH: 1.821 u[IU]/mL (ref 0.350–4.500)

## 2013-10-31 NOTE — Telephone Encounter (Signed)
Patient walk-in advised she just had blood work done today and you can try to call her new phone she just got but she has been having some problems with the phone so if it doesn't work or you cant get her to please do like before and just drop a card in the mail about her labs. Thanks

## 2013-11-01 NOTE — Telephone Encounter (Signed)
Letter sent to pt.Kiara Clayton

## 2013-11-07 ENCOUNTER — Encounter: Payer: Self-pay | Admitting: Cardiology

## 2013-11-07 ENCOUNTER — Encounter: Payer: Self-pay | Admitting: *Deleted

## 2013-11-08 ENCOUNTER — Ambulatory Visit (INDEPENDENT_AMBULATORY_CARE_PROVIDER_SITE_OTHER): Payer: Medicare Other | Admitting: Cardiology

## 2013-11-08 ENCOUNTER — Encounter: Payer: Self-pay | Admitting: Cardiology

## 2013-11-08 ENCOUNTER — Other Ambulatory Visit: Payer: Self-pay | Admitting: *Deleted

## 2013-11-08 VITALS — BP 140/80 | HR 69 | Wt 194.0 lb

## 2013-11-08 DIAGNOSIS — R7989 Other specified abnormal findings of blood chemistry: Secondary | ICD-10-CM

## 2013-11-08 DIAGNOSIS — R079 Chest pain, unspecified: Secondary | ICD-10-CM

## 2013-11-08 DIAGNOSIS — E876 Hypokalemia: Secondary | ICD-10-CM

## 2013-11-08 DIAGNOSIS — I10 Essential (primary) hypertension: Secondary | ICD-10-CM

## 2013-11-08 DIAGNOSIS — R945 Abnormal results of liver function studies: Secondary | ICD-10-CM

## 2013-11-08 NOTE — Assessment & Plan Note (Signed)
B/P controlled 

## 2013-11-08 NOTE — Progress Notes (Signed)
HPI: 73 year old female for evaluation of chest pain. Laboratories in March of 2015 showed normal hemoglobin; SGOT 68 and SGPT 142. Laboratories in April of 2015 showed a normal TSH and normal renal function. Patient has no prior cardiac history. Developed diarrhea approximately one month ago. During that time, noted SSCP without radiation or associated symptoms. Pain only occurred between 4 and 6 AM; lasted one hour and resolved; no further pain in past month. No exertional CP. Some DOE; no orthopnea, PND, pedal edema or syncope. No palpitations.  Current Outpatient Prescriptions  Medication Sig Dispense Refill  . CALCIUM PO Take 1,000 mg by mouth daily.       . cetirizine (ZYRTEC) 10 MG tablet Take 1 tablet (10 mg total) by mouth daily.  30 tablet  1  . cyclobenzaprine (FLEXERIL) 10 MG tablet Take 1 tablet (10 mg total) by mouth 2 (two) times daily as needed.  60 tablet  5  . levothyroxine (SYNTHROID, LEVOTHROID) 125 MCG tablet Take 125 mcg by mouth daily before breakfast.      . nitroGLYCERIN (NITROSTAT) 0.4 MG SL tablet Place 1 tablet (0.4 mg total) under the tongue every 5 (five) minutes as needed for chest pain (or tightness).  30 tablet  0  . NON FORMULARY Joint pill 1 tab po qd      . levothyroxine (SYNTHROID, LEVOTHROID) 137 MCG tablet TAKE ONE TABLET BY MOUTH ONCE DAILY  30 tablet  1   No current facility-administered medications for this visit.    Allergies  Allergen Reactions  . Penicillins     Past Medical History  Diagnosis Date  . Heart murmur   . Diphtheria   . Essential hypertension, benign   . Trochanteric bursitis of right hip   . Rheumatoid arthritis   . Post herpetic neuralgia     On Lyrica.  Sees Dr. Hurley Cisco (rheumatology).     . Palate deformity   . IFG (impaired fasting glucose)   . Hypothyroid   . Hypercalcemia   . Hearing loss   . Fibromyalgia     See Dr. Charlestine Night (rhuem) in [place]. Uses Aleve and cyclobenzaprine and lyrica.  Cyclobenzaprine  no longer covered as of 2015 by [facility].   Marland Kitchen Asthma, chronic   . Allergic rhinitis     Past Surgical History  Procedure Laterality Date  . Total abdominal hysterectomy w/ bilateral salpingoophorectomy  1988  . Lumbar disc surgery  1990    Dr. Verdene Lennert, [place]  . Breast biopsy  1988    Dr. Bubba Camp, no cancer.   . Tonsillectomy and adenoidectomy      As a child   . Cleft palate repair  1970    [place]    History   Social History  . Marital Status: Widowed    Spouse Name: N/A    Number of Children: 0  . Years of Education: N/A   Occupational History  . retired Production manager    Social History Main Topics  . Smoking status: Former Smoker    Quit date: 07/20/1968  . Smokeless tobacco: Former Systems developer    Quit date: 07/20/1968  . Alcohol Use: 0.5 oz/week    1 drink(s) per week     Comment: 2-3 glasse wine per week  . Drug Use: No  . Sexual Activity: Not on file   Other Topics Concern  . Not on file   Social History Narrative   3 caffeinated drinks per day   No  regular exericse.     Family History  Problem Relation Age of Onset  . Adopted: Yes  . Alcohol abuse Sister   . Stomach cancer Maternal Aunt   . Brain cancer Mother   . Stroke Mother     deceased  . COPD Father   . Diabetes Mother   . Hypertension Mother   . Heart disease Brother     Unknown    ROS: arthralgias but no fevers or chills, productive cough, hemoptysis, dysphasia, odynophagia, melena, hematochezia, dysuria, hematuria, rash, seizure activity, orthopnea, PND, pedal edema, claudication. Remaining systems are negative.  Physical Exam:   Blood pressure 140/80, pulse 69, weight 194 lb (87.998 kg).  General:  Well developed/well nourished in NAD Skin warm/dry Patient not depressed No peripheral clubbing Back-normal HEENT-normal/normal eyelids Neck supple/normal carotid upstroke bilaterally; no bruits; no JVD; no thyromegaly chest - CTA/ normal expansion CV - RRR/normal S1 and S2; no  murmurs, rubs or gallops;  PMI nondisplaced Abdomen -NT/ND, no HSM, no mass, + bowel sounds, no bruit 2+ femoral pulses, no bruits Ext-no edema, chords, 2+ DP; rheumatoid arthritis changes. Neuro-grossly nonfocal  ECG NSR with no ST changes

## 2013-11-08 NOTE — Assessment & Plan Note (Signed)
Symptoms extremely atypical and have resolved; ? GI related at time of diarrhea. ECG normal; no exertional symptoms. Will not pursue further cardiac WU unless develops recurrent symptoms.

## 2013-11-08 NOTE — Assessment & Plan Note (Signed)
I have asked pt to follow up with primary care for this issue.

## 2013-11-08 NOTE — Patient Instructions (Signed)
Your physician recommends that you schedule a follow-up appointment in: AS NEEDED  

## 2013-11-18 LAB — POTASSIUM: POTASSIUM: 4.5 meq/L (ref 3.5–5.3)

## 2013-11-20 ENCOUNTER — Encounter: Payer: Self-pay | Admitting: *Deleted

## 2013-11-21 ENCOUNTER — Encounter: Payer: Self-pay | Admitting: Family Medicine

## 2013-11-21 ENCOUNTER — Telehealth: Payer: Self-pay | Admitting: Family Medicine

## 2013-11-21 ENCOUNTER — Ambulatory Visit (INDEPENDENT_AMBULATORY_CARE_PROVIDER_SITE_OTHER): Payer: Medicare Other | Admitting: Family Medicine

## 2013-11-21 VITALS — BP 152/82 | HR 92 | Wt 195.0 lb

## 2013-11-21 DIAGNOSIS — R748 Abnormal levels of other serum enzymes: Secondary | ICD-10-CM

## 2013-11-21 DIAGNOSIS — E039 Hypothyroidism, unspecified: Secondary | ICD-10-CM

## 2013-11-21 MED ORDER — LEVOTHYROXINE SODIUM 125 MCG PO TABS: 125.0000 ug | ORAL_TABLET | Freq: Every day | ORAL | Status: DC

## 2013-11-21 NOTE — Telephone Encounter (Signed)
Patient wanted to let you know that her Liver testing results were Abnormal from

## 2013-11-21 NOTE — Progress Notes (Signed)
   Subjective:    Patient ID: Kiara Clayton, female    DOB: 02-Oct-1940, 73 y.o.   MRN: [id number]  HPI Hypothyroidism-here to followup. Previous TSH was still slightly elevated. Her dose was increased her 137 mcg. Patient doesn't have a working telephone so she was mailed a [employer]. She says she never received it and has stayed, 125 mcg dose. She did have her repeat labs done a couple weeks ago and TSH was actually in the normal range.   Followup elevated liver enzymes. Her last lab work showed increase in liver enzymes. She's really not taking any medications that should be causing this. No over-the-counter meds. She has had problems with elevated liver enzymes in the past but they do tend to fluctuate. She denies any abdominal pain or nausea. She does not take Tylenol or other medications. She does drink alcohol a couple times a week. Review of Systems     Objective:   Physical Exam  Constitutional: She is oriented to person, place, and time. She appears well-developed and well-nourished.  HENT:  Head: Normocephalic and atraumatic.  Cardiovascular: Normal rate, regular rhythm and normal heart sounds.   Pulmonary/Chest: Effort normal and breath sounds normal.  Abdominal: Soft. Bowel sounds are normal. She exhibits no distension and no mass. There is no tenderness. There is no rebound and no guarding.  Neurological: She is alert and oriented to person, place, and time.  Skin: Skin is warm and dry.  Psychiatric: She has a normal mood and affect. Her behavior is normal.          Assessment & Plan:  Hypothyroidism-continue 125 mcg daily. Repeat thyroid level in 4-6 months. She's doing well on current regimen. No side effects or concerns.  Elevated liver enzymes-repeat today and will add an acute hepatitis panel. We'll also schedule for ultrasound the liver for further evaluation as well.

## 2013-11-22 ENCOUNTER — Encounter: Payer: Self-pay | Admitting: *Deleted

## 2013-11-22 LAB — HEPATIC FUNCTION PANEL
ALT: 28 U/L (ref 0–35)
AST: 28 U/L (ref 0–37)
Albumin: 4.3 g/dL (ref 3.5–5.2)
Alkaline Phosphatase: 73 U/L (ref 39–117)
BILIRUBIN DIRECT: 0.1 mg/dL (ref 0.0–0.3)
BILIRUBIN INDIRECT: 0.5 mg/dL (ref 0.2–1.2)
Total Bilirubin: 0.6 mg/dL (ref 0.2–1.2)
Total Protein: 7.2 g/dL (ref 6.0–8.3)

## 2013-11-22 LAB — HEPATITIS PANEL, ACUTE
HCV AB: NEGATIVE
HEP B C IGM: NONREACTIVE
Hep A IgM: NONREACTIVE
Hepatitis B Surface Ag: NEGATIVE

## 2013-11-23 ENCOUNTER — Ambulatory Visit: Payer: Medicare Other | Admitting: Family Medicine

## 2013-11-24 ENCOUNTER — Ambulatory Visit (INDEPENDENT_AMBULATORY_CARE_PROVIDER_SITE_OTHER): Payer: Medicare Other

## 2013-11-24 ENCOUNTER — Encounter: Payer: Self-pay | Admitting: *Deleted

## 2013-11-24 DIAGNOSIS — K7689 Other specified diseases of liver: Secondary | ICD-10-CM

## 2013-11-24 DIAGNOSIS — R748 Abnormal levels of other serum enzymes: Secondary | ICD-10-CM

## 2013-11-28 ENCOUNTER — Ambulatory Visit (INDEPENDENT_AMBULATORY_CARE_PROVIDER_SITE_OTHER): Payer: Medicare Other | Admitting: Family Medicine

## 2013-11-28 ENCOUNTER — Encounter: Payer: Self-pay | Admitting: Family Medicine

## 2013-11-28 VITALS — BP 153/67 | HR 74 | Ht 65.0 in | Wt 196.0 lb

## 2013-11-28 DIAGNOSIS — K7689 Other specified diseases of liver: Secondary | ICD-10-CM

## 2013-11-28 DIAGNOSIS — K760 Fatty (change of) liver, not elsewhere classified: Secondary | ICD-10-CM | POA: Insufficient documentation

## 2013-11-28 DIAGNOSIS — B0229 Other postherpetic nervous system involvement: Secondary | ICD-10-CM

## 2013-11-28 MED ORDER — VALACYCLOVIR HCL 500 MG PO TABS: 500.0000 mg | ORAL_TABLET | Freq: Every day | ORAL | Status: DC

## 2013-11-28 NOTE — Progress Notes (Addendum)
   Subjective:    Patient ID: Kiara Clayton, female    DOB: 11/16/1940, 73 y.o.   MRN: [id number]  HPI Here to followup on abnormal elevated liver enzymes. We schedule her for an ultrasound was consistent with fatty liver disease. She was here to review those results today. We also repeated the liver enzymes and a week ago and they have come back down. She is really made some dietary changes and has been trying to work on losing weight.  Unfortunately she gets recurrent postherpetic neuralgia in the summertime where she previous had an outbreak of shingles. She wants to know if there any other treatments are on the market that can be helpful for her.  Review of Systems     Objective:   Physical Exam  Constitutional: She is oriented to person, place, and time. She appears well-developed and well-nourished.  HENT:  Head: Normocephalic and atraumatic.  Eyes: Conjunctivae are normal. Pupils are equal, round, and reactive to light.  Neurological: She is alert and oriented to person, place, and time.  Skin: Skin is warm and dry.  Psychiatric: She has a normal mood and affect. Her behavior is normal.          Assessment & Plan:  Fatty liver-discussed diagnosis. Handout provided. Work on diet and exercise and weight loss. Explained there no other treatments except for lifestyle change. We'll continue to monitor liver enzymes every 6 months. But they are back down which is somewhat reassuring.  Postherpetic neuralgia-recommend a trial of low-dose Valtrex daily over the summer time to see if this helps suppress her symptoms.

## 2013-11-28 NOTE — Patient Instructions (Signed)
Fatty Liver °Fatty liver is the accumulation of fat in liver cells. It is also called hepatosteatosis or steatohepatitis. It is normal for your liver to contain some fat. If fat is more than 5 to 10% of your liver's weight, you have fatty liver.  °There are often no symptoms (problems) for years while damage is still occurring. People often learn about their fatty liver when they have medical tests for other reasons. Fat can damage your liver for years or even decades without causing problems. When it becomes severe, it can cause fatigue, weight loss, weakness, and confusion. °This makes you more likely to develop more serious liver problems. The liver is the largest organ in the body. It does a lot of work and often gives no warning signs when it is sick until late in a disease. °The liver has many important jobs including: °· Breaking down foods. °· Storing vitamins, iron, and other minerals. °· Making proteins. °· Making bile for food digestion. °· Breaking down many products including medications, alcohol and some poisons. °CAUSES  °There are a number of different conditions, medications, and poisons that can cause a fatty liver. Eating too many calories causes fat to build up in the liver. Not processing and breaking fats down normally may also cause this. Certain conditions, such as obesity, diabetes, and high triglycerides also cause this. Most fatty liver patients tend to be middle-aged and over weight.  °Some causes of fatty liver are: °· Alcohol over consumption. °· Malnutrition. °· Steroid use. °· Valproic acid toxicity. °· Obesity. °· Cushing's syndrome. °· Poisons. °· Tetracycline in high dosages. °· Pregnancy. °· Diabetes. °· Hyperlipidemia. °· Rapid weight loss. °Some people develop fatty liver even having none of these conditions. °SYMPTOMS  °Fatty liver most often causes no problems. This is called asymptomatic. °· It can be diagnosed with blood tests and also by a liver biopsy. °· It is one of the  most common causes of minor elevations of liver enzymes on routine blood tests. °· Specialized Imaging of the liver using ultrasound, CT (computed tomography) scan, or MRI (magnetic resonance imaging) can suggest a fatty liver but a biopsy is needed to confirm it. °· A biopsy involves taking a small sample of liver tissue. This is done by using a needle. It is then looked at under a microscope by a specialist. °TREATMENT  °It is important to treat the cause. Simple fatty liver without a medical reason may not need treatment. °· Weight loss, fat restriction, and exercise in overweight patients produces inconsistent results but is worth trying. °· Fatty liver due to alcohol toxicity may not improve even with stopping drinking. °· Good control of diabetes may reduce fatty liver. °· Lower your triglycerides through diet, medication or both. °· Eat a balanced, healthy diet. °· Increase your physical activity. °· Get regular checkups from a liver specialist. °· There are no medical or surgical treatments for a fatty liver or NASH, but improving your diet and increasing your exercise may help prevent or reverse some of the damage. °PROGNOSIS  °Fatty liver may cause no damage or it can lead to an inflammation of the liver. This is, called steatohepatitis. When it is linked to alcohol abuse, it is called alcoholic steatohepatitis. It often is not linked to alcohol. It is then called nonalcoholic steatohepatitis, or NASH. Over time the liver may become scarred and hardened. This condition is called cirrhosis. Cirrhosis is serious and may lead to liver failure or cancer. NASH is one of the   leading causes of cirrhosis. About 10-20% of Americans have fatty liver and a smaller 2-5% has NASH. °Document Released: 08/21/2005 Document Revised: 09/28/2011 Document Reviewed: 10/14/2005 °[employer]® Patient Information ©2014 [employer]. ° °

## 2014-01-11 ENCOUNTER — Encounter: Payer: Self-pay | Admitting: Family Medicine

## 2014-01-11 ENCOUNTER — Ambulatory Visit (INDEPENDENT_AMBULATORY_CARE_PROVIDER_SITE_OTHER): Payer: Medicare Other | Admitting: Family Medicine

## 2014-01-11 VITALS — BP 175/84 | HR 109 | Wt 195.0 lb

## 2014-01-11 DIAGNOSIS — Z79899 Other long term (current) drug therapy: Secondary | ICD-10-CM

## 2014-01-11 DIAGNOSIS — M21619 Bunion of unspecified foot: Secondary | ICD-10-CM

## 2014-01-11 DIAGNOSIS — M2010 Hallux valgus (acquired), unspecified foot: Secondary | ICD-10-CM

## 2014-01-11 DIAGNOSIS — B351 Tinea unguium: Secondary | ICD-10-CM

## 2014-01-11 DIAGNOSIS — L821 Other seborrheic keratosis: Secondary | ICD-10-CM

## 2014-01-11 MED ORDER — TERBINAFINE HCL 250 MG PO TABS: 250.0000 mg | ORAL_TABLET | Freq: Every day | ORAL | Status: DC

## 2014-01-11 NOTE — Progress Notes (Signed)
   Subjective:    Patient ID: Kiara Clayton, female    DOB: [birth year], 73 y.o.   MRN: [id number]  HPI Martin Majestic to a rheumatologist the other day and mentioned she has a toenail fungus.  They recommended she discussed with PCP.  Has a darkness on the edge of the great toenail and it has been tender.  She does have bunions. She wears custom orthotics.  She has been told she needs surgery but doesn't want to do anything about it.  She is on prednisone with the rheumatologist.  She has been using some topical antifungals over-the-counter but they have not helped.  Her face tingles from her chin to her upper lip when she eats.  No swelling or palsy or drooping.  Typically she puts her prosthesis in and then start eating in the area starts to feel numb and tingle. This typically better later in the day. She wonders if it could be her prosthesis.  She also has a spot near her left temple that is crusty and wanted me to look at it. Says she is worried it is a cyst.   Review of Systems     Objective:   Physical Exam  Constitutional: She is oriented to person, place, and time. She appears well-developed and well-nourished.  HENT:  Head: Normocephalic and atraumatic.  Neck: Neck supple. No thyromegaly present.  Lymphadenopathy:    She has no cervical adenopathy.  Neurological: She is alert and oriented to person, place, and time.  Skin: Skin is warm.  Psychiatric: She has a normal mood and affect. Her behavior is normal.   Right great toeanil is thick and cracking and dark.  She also has a dark nail on the 2nd toe of the left foot. She does have bilateral bunions.    Skin lesion on left temple is most consistent with a seborrheic keratosis.     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 Since the fungus is completely through the nail then I recommend oral Lamisil. Topicals will not be very helpful. Will need to follow her liver enzymes carefully since she has had some recent  elevation of enzymes. Will recheck level in one month and then again in 3 months.   Numbness over the facial nerve/mandibular nerve- I. think this could definitely be from the prosthesis. It could be pressing on I nerve which is causing some superficial numbness and tingling. She goes to [facility] for this. Encouraged her to make an appointment to get in and have them look at the area and also the area in her mouth.  Seborrheic keratosis-gave patient reassurance. Certainly this could be treated with cryotherapy if she would like it does not look like a cyst and tried to reassure her.  Bunions-she probably optimally needs to have surgery but says she is happy with her orthotic inserts that are custom made. She says she notices a difference when she wears them versus when she does not.

## 2014-01-11 NOTE — Patient Instructions (Signed)
Go to the lab in about one month to recheck her liver enzymes on the Lamisil.

## 2014-02-07 LAB — COMPLETE METABOLIC PANEL WITH GFR
ALT: 26 U/L (ref 0–35)
AST: 24 U/L (ref 0–37)
Albumin: 4.2 g/dL (ref 3.5–5.2)
Alkaline Phosphatase: 56 U/L (ref 39–117)
BUN: 11 mg/dL (ref 6–23)
CO2: 27 mEq/L (ref 19–32)
CREATININE: 0.82 mg/dL (ref 0.50–1.10)
Calcium: 9.9 mg/dL (ref 8.4–10.5)
Chloride: 101 mEq/L (ref 96–112)
GFR, EST AFRICAN AMERICAN: 83 mL/min
GFR, Est Non African American: 72 mL/min
GLUCOSE: 187 mg/dL — AB (ref 70–99)
Potassium: 4.2 mEq/L (ref 3.5–5.3)
SODIUM: 141 meq/L (ref 135–145)
TOTAL PROTEIN: 7.3 g/dL (ref 6.0–8.3)
Total Bilirubin: 0.6 mg/dL (ref 0.2–1.2)

## 2014-02-08 ENCOUNTER — Encounter: Payer: Self-pay | Admitting: *Deleted

## 2014-02-12 ENCOUNTER — Ambulatory Visit (INDEPENDENT_AMBULATORY_CARE_PROVIDER_SITE_OTHER): Payer: Medicare Other | Admitting: Family Medicine

## 2014-02-12 ENCOUNTER — Encounter: Payer: Self-pay | Admitting: Family Medicine

## 2014-02-12 VITALS — BP 174/85 | HR 91 | Temp 97.7°F | Wt 196.0 lb

## 2014-02-12 DIAGNOSIS — B351 Tinea unguium: Secondary | ICD-10-CM

## 2014-02-12 NOTE — Progress Notes (Signed)
   Subjective:    Patient ID: Kiara Clayton, female    DOB: 08-Mar-1941, 73 y.o.   MRN: [id number]  HPI Here for followup of onychomycosis-she's been tolerating Lamisil well without any side effects or problems. She has noticed a little bit of new growth at the base of the toenails, especially on her left great toe which is very thick and yellow. She is a little bit of darkening on the lateral edge of the right great toenail and that looks like it's actually improving at the bases well. She wants to recheck her liver and keep a really tight eye on this. She has had history of some elevated LFTs in the past.   Review of Systems     Objective:   Physical Exam  Constitutional: She appears well-developed.  HENT:  Head: Normocephalic.  Skin: Skin is warm and dry.  Left great toenail is yellow and thick. There some new nail growth at the base appears to be clear. On the right great toenail along the lateral edge there appears to be some darkening of the nail. Again there is some new growth at the base it appears to be clear.  Psychiatric: She has a normal mood and affect. Her behavior is normal.          Assessment & Plan:  Oncychomycosis - tolerating medication well. Continue for a full 3 months of liver enzymes looked okay. Lab slip provided today.

## 2014-02-13 ENCOUNTER — Encounter: Payer: Self-pay | Admitting: Family Medicine

## 2014-02-13 LAB — COMPLETE METABOLIC PANEL WITH GFR
ALK PHOS: 51 U/L (ref 39–117)
ALT: 24 U/L (ref 0–35)
AST: 21 U/L (ref 0–37)
Albumin: 4.1 g/dL (ref 3.5–5.2)
BUN: 10 mg/dL (ref 6–23)
CHLORIDE: 104 meq/L (ref 96–112)
CO2: 27 mEq/L (ref 19–32)
CREATININE: 0.85 mg/dL (ref 0.50–1.10)
Calcium: 10.2 mg/dL (ref 8.4–10.5)
GFR, Est African American: 80 mL/min
GFR, Est Non African American: 69 mL/min
Glucose, Bld: 218 mg/dL — ABNORMAL HIGH (ref 70–99)
Potassium: 4.3 mEq/L (ref 3.5–5.3)
Sodium: 141 mEq/L (ref 135–145)
Total Bilirubin: 0.5 mg/dL (ref 0.2–1.2)
Total Protein: 6.9 g/dL (ref 6.0–8.3)

## 2014-03-13 ENCOUNTER — Ambulatory Visit: Payer: Medicare Other

## 2014-03-13 ENCOUNTER — Ambulatory Visit (INDEPENDENT_AMBULATORY_CARE_PROVIDER_SITE_OTHER): Payer: Medicare Other | Admitting: Sports Medicine

## 2014-03-13 ENCOUNTER — Encounter: Payer: Self-pay | Admitting: Sports Medicine

## 2014-03-13 ENCOUNTER — Ambulatory Visit (INDEPENDENT_AMBULATORY_CARE_PROVIDER_SITE_OTHER): Payer: Medicare Other

## 2014-03-13 VITALS — BP 151/77 | HR 97 | Wt 202.0 lb

## 2014-03-13 DIAGNOSIS — R2241 Localized swelling, mass and lump, right lower limb: Secondary | ICD-10-CM | POA: Insufficient documentation

## 2014-03-13 DIAGNOSIS — R29898 Other symptoms and signs involving the musculoskeletal system: Secondary | ICD-10-CM

## 2014-03-13 NOTE — Progress Notes (Signed)
Patient ID: Kiara Clayton, female   DOB: [birth year], 73 y.o.   MRN: [id number]   Subjective:    I'm seeing this patient as a consultation for: Kiara Lecher, MD  CC: Right hip pain  HPI: Kiara Clayton is a pleasant 73 year old female with history of rheumatoid arthritis who presents with one month of right hip pain. Upon further questioning, she reports that the pain is localized to a mass on the proximal, lateral aspect of her right thigh. She reports that the mass appeared about one month ago (states that it had not yet appeared during her 7/27 visit with Dr. Madilyn Fireman) and has grown rapidly since. Pain is worse after walking and is not improved with Tylenol. No radicular symptoms present.  Past medical history, Surgical history, Family history not pertinant except as noted below, Social history, Allergies, and medications have been entered into the medical record, reviewed, and no changes needed.   Review of Systems: No headache, visual changes, nausea, vomiting, diarrhea, constipation, dizziness, abdominal pain, skin rash, fevers, chills, night sweats, weight loss, swollen lymph nodes, body aches, joint swelling, muscle aches, chest pain, shortness of breath, mood changes, visual or auditory hallucinations.   Objective:   General: Well Developed, well nourished, and in no acute distress.  Neuro/Psych: Alert and oriented x3, extra-ocular muscles intact, able to move all 4 extremities, sensation grossly intact. Skin: Warm and dry, no rashes noted. Respiratory: Not using accessory muscles, speaking in full sentences, trachea midline.  Cardiovascular: Pulses palpable, no extremity edema. Abdomen: Does not appear distended.  Right Hip: ROM IR: 45 Deg, ER: 45 Deg, Flexion: 120 Deg, Extension: 100 Deg, Abduction: 45 Deg, Adduction: 45 Deg Strength IR: 5/5, ER: 5/5, Flexion: 5/5, Extension: 5/5, Abduction: 5/5, Adduction: 5/5 Pelvic alignment unremarkable to inspection and  palpation. Standing hip rotation and gait without trendelenburg sign / unsteadiness. Greater trochanter without tenderness to palpation. No tenderness over piriformis. No pain with FABER or FADIR. No SI joint tenderness and normal minimal SI movement.  Right Leg: Large, mobile mass palpable on the right leg that feels to be in the rectus femoris or right vastus lateralis proximally.  Impression and Recommendations:   This case required medical decision making of moderate complexity.  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 Ultrasound and x-ray will be done, likely with subsequent MRI of the right hip.

## 2014-03-13 NOTE — Addendum Note (Signed)
Addended by: Silverio Decamp on: 03/13/2014 03:52 PM   Modules accepted: Orders

## 2014-03-13 NOTE — Assessment & Plan Note (Addendum)
There is a large mass that feels to be in the rectus femoris versus right vastus lateralis proximally. Ultrasound and x-ray, we will likely need an MRI of the right hip with IV contrast. Certainly I do have concern for soft tissue sarcoma.  Ultrasound does show a large soft tissue mass without blood flow. We are going to proceed with an MRI with and without IV contrast of the right hip to further characterize the mass.

## 2014-03-14 ENCOUNTER — Other Ambulatory Visit: Payer: Self-pay | Admitting: Family Medicine

## 2014-03-14 ENCOUNTER — Telehealth: Payer: Self-pay | Admitting: *Deleted

## 2014-03-14 NOTE — Telephone Encounter (Signed)
MRI hip [id number] approved thru 04/28/14. [phone]. Margette Fast, CMA

## 2014-03-21 ENCOUNTER — Other Ambulatory Visit: Payer: Self-pay | Admitting: Sports Medicine

## 2014-03-21 ENCOUNTER — Ambulatory Visit: Payer: Medicare Other

## 2014-03-21 DIAGNOSIS — R2241 Localized swelling, mass and lump, right lower limb: Secondary | ICD-10-CM

## 2014-03-24 ENCOUNTER — Ambulatory Visit (HOSPITAL_BASED_OUTPATIENT_CLINIC_OR_DEPARTMENT_OTHER)
Admission: RE | Admit: 2014-03-24 | Discharge: 2014-03-24 | Disposition: A | Discharge status: Home / Self Care | Payer: Medicare Other | Source: Ambulatory Visit | Attending: Sports Medicine | Admitting: Sports Medicine

## 2014-03-24 DIAGNOSIS — M259 Joint disorder, unspecified: Secondary | ICD-10-CM | POA: Insufficient documentation

## 2014-03-24 DIAGNOSIS — R1900 Intra-abdominal and pelvic swelling, mass and lump, unspecified site: Secondary | ICD-10-CM | POA: Insufficient documentation

## 2014-03-24 DIAGNOSIS — R2241 Localized swelling, mass and lump, right lower limb: Secondary | ICD-10-CM

## 2014-03-24 DIAGNOSIS — R937 Abnormal findings on diagnostic imaging of other parts of musculoskeletal system: Secondary | ICD-10-CM | POA: Insufficient documentation

## 2014-03-24 DIAGNOSIS — M76899 Other specified enthesopathies of unspecified lower limb, excluding foot: Secondary | ICD-10-CM | POA: Insufficient documentation

## 2014-03-24 DIAGNOSIS — M6250 Muscle wasting and atrophy, not elsewhere classified, unspecified site: Secondary | ICD-10-CM | POA: Diagnosis not present

## 2014-03-24 DIAGNOSIS — R1909 Other intra-abdominal and pelvic swelling, mass and lump: Secondary | ICD-10-CM | POA: Diagnosis present

## 2014-03-24 MED ORDER — GADOBENATE DIMEGLUMINE 529 MG/ML IV SOLN: 20.0000 mL | Freq: Once | INTRAVENOUS | Status: AC | PRN

## 2014-03-29 ENCOUNTER — Ambulatory Visit (INDEPENDENT_AMBULATORY_CARE_PROVIDER_SITE_OTHER): Payer: Medicare Other

## 2014-03-29 ENCOUNTER — Telehealth: Payer: Self-pay | Admitting: Sports Medicine

## 2014-03-29 ENCOUNTER — Ambulatory Visit (INDEPENDENT_AMBULATORY_CARE_PROVIDER_SITE_OTHER): Payer: Medicare Other | Admitting: Sports Medicine

## 2014-03-29 ENCOUNTER — Ambulatory Visit: Payer: Medicare Other | Admitting: Sports Medicine

## 2014-03-29 ENCOUNTER — Encounter: Payer: Self-pay | Admitting: Sports Medicine

## 2014-03-29 VITALS — BP 133/73 | HR 95 | Wt 200.0 lb

## 2014-03-29 DIAGNOSIS — R2241 Localized swelling, mass and lump, right lower limb: Secondary | ICD-10-CM

## 2014-03-29 DIAGNOSIS — M5416 Radiculopathy, lumbar region: Secondary | ICD-10-CM

## 2014-03-29 DIAGNOSIS — M5137 Other intervertebral disc degeneration, lumbosacral region: Secondary | ICD-10-CM

## 2014-03-29 DIAGNOSIS — M25569 Pain in unspecified knee: Secondary | ICD-10-CM

## 2014-03-29 DIAGNOSIS — IMO0002 Reserved for concepts with insufficient information to code with codable children: Secondary | ICD-10-CM

## 2014-03-29 DIAGNOSIS — M1710 Unilateral primary osteoarthritis, unspecified knee: Secondary | ICD-10-CM

## 2014-03-29 DIAGNOSIS — R29898 Other symptoms and signs involving the musculoskeletal system: Secondary | ICD-10-CM

## 2014-03-29 DIAGNOSIS — M79604 Pain in right leg: Secondary | ICD-10-CM | POA: Insufficient documentation

## 2014-03-29 DIAGNOSIS — M79605 Pain in left leg: Secondary | ICD-10-CM | POA: Insufficient documentation

## 2014-03-29 NOTE — Assessment & Plan Note (Addendum)
It is possible that this represents a bilateral L3 versus L4 radiculitis, patellofemoral syndrome is also a possibility considering degenerative changes in her knee x-rays. Lumbar spine and knee x-rays, Solu-Medrol 125 intramuscular, lumbar spine and the patellofemoral rehabilitation exercises given. Return in one month, if no better. Treatment will depend on where I think the pain generators, considering severity of knee osteoarthritis I think we will probably be injecting her knees.Marland Kitchen

## 2014-03-29 NOTE — Assessment & Plan Note (Signed)
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 No intervention needed.

## 2014-03-29 NOTE — Telephone Encounter (Signed)
Patient stated that she would like to have her office notes from today and x-ray results sent to Dr. Pearlie Oyster her Rheumatoid Arthritis Dr. [place].

## 2014-03-29 NOTE — Progress Notes (Signed)
  Subjective:    CC: Followup  HPI: Right thigh mass: MRI showed hypertrophy of the tensor fascia lata, no treatment needed.  Bilateral anterior knee pain: moderate, persistent, desires not to take any more pills, it is worse when getting up from a seated position, she does endorse significant back pain, pain does not radiate below the knees. It's not worse with Valsalva, is not worse going up and down stairs.  Past medical history, Surgical history, Family history not pertinant except as noted below, Social history, Allergies, and medications have been entered into the medical record, reviewed, and no changes needed.   Review of Systems: No fevers, chills, night sweats, weight loss, chest pain, or shortness of breath.   Objective:    General: Well Developed, well nourished, and in no acute distress.  Neuro: Alert and oriented x3, extra-ocular muscles intact, sensation grossly intact.  HEENT: Normocephalic, atraumatic, pupils equal round reactive to light, neck supple, no masses, no lymphadenopathy, thyroid nonpalpable.  Skin: Warm and dry, no rashes. Cardiac: Regular rate and rhythm, no murmurs rubs or gallops, no lower extremity edema.  Respiratory: Clear to auscultation bilaterally. Not using accessory muscles, speaking in full sentences. Bilateral Knee: Normal to inspection with no erythema or effusion or obvious bony abnormalities. Palpation normal with no warmth or joint line tenderness or patellar tenderness or condyle tenderness. ROM normal in flexion and extension and lower leg rotation. Ligaments with solid consistent endpoints including ACL, PCL, LCL, MCL. Negative Mcmurray's and provocative meniscal tests. Non painful patellar compression. Mild crepitus with patellar compression. Patellar and quadriceps tendons unremarkable. Hamstring and quadriceps strength is normal.  Lumbar spine x-rays show L4-L5 degenerative changes and bilateral severe knee  osteoarthritis.  Impression and Recommendations:

## 2014-03-30 MED ORDER — METHYLPREDNISOLONE SODIUM SUCC 125 MG IJ SOLR
125.0000 mg | Freq: Once | INTRAMUSCULAR | Status: AC
  Administered 2014-03-30: 125 mg via INTRAMUSCULAR

## 2014-03-30 NOTE — Addendum Note (Signed)
Addended by: Bo Mcclintock C on: 03/30/2014 01:09 PM   Modules accepted: Orders

## 2014-05-23 ENCOUNTER — Other Ambulatory Visit: Payer: Self-pay

## 2014-05-23 DIAGNOSIS — E039 Hypothyroidism, unspecified: Secondary | ICD-10-CM

## 2014-05-23 MED ORDER — LEVOTHYROXINE SODIUM 125 MCG PO TABS: 125.0000 ug | ORAL_TABLET | Freq: Every day | ORAL | Status: DC

## 2014-06-07 ENCOUNTER — Telehealth: Payer: Self-pay | Admitting: Family Medicine

## 2014-06-07 DIAGNOSIS — M069 Rheumatoid arthritis, unspecified: Secondary | ICD-10-CM

## 2014-06-07 NOTE — Telephone Encounter (Signed)
Patient walked-in request to get a referral to a Rheumatoid Arthritis referral for her leg somewhere in [place]. Patient does not have a phone so she said she would check back on Wednesday Mid-morning w/ Anderson Malta about this information. Thanks

## 2014-06-07 NOTE — Telephone Encounter (Signed)
Referral placed.

## 2014-07-26 ENCOUNTER — Other Ambulatory Visit: Payer: Self-pay | Admitting: Family Medicine

## 2014-07-26 DIAGNOSIS — Z1231 Encounter for screening mammogram for malignant neoplasm of breast: Secondary | ICD-10-CM

## 2014-08-01 ENCOUNTER — Ambulatory Visit (INDEPENDENT_AMBULATORY_CARE_PROVIDER_SITE_OTHER): Payer: Medicare Other

## 2014-08-01 DIAGNOSIS — Z1231 Encounter for screening mammogram for malignant neoplasm of breast: Secondary | ICD-10-CM

## 2014-08-02 ENCOUNTER — Encounter: Payer: Self-pay | Admitting: *Deleted

## 2014-10-02 LAB — CBC AND DIFFERENTIAL: WBC: 7.3 10^3/mL

## 2014-10-02 LAB — TSH: TSH: 0.4 u[IU]/mL — AB (ref 0.41–5.90)

## 2014-10-02 LAB — CMP14+LP+1AC+CBC/D/PLT+TSH: CALCIUM: 11.4 mg/dL

## 2014-10-10 ENCOUNTER — Ambulatory Visit (INDEPENDENT_AMBULATORY_CARE_PROVIDER_SITE_OTHER): Payer: Medicare Other | Admitting: Family Medicine

## 2014-10-10 ENCOUNTER — Encounter: Payer: Self-pay | Admitting: Family Medicine

## 2014-10-10 VITALS — BP 154/79 | HR 94 | Wt 187.0 lb

## 2014-10-10 DIAGNOSIS — M069 Rheumatoid arthritis, unspecified: Secondary | ICD-10-CM

## 2014-10-10 DIAGNOSIS — E039 Hypothyroidism, unspecified: Secondary | ICD-10-CM

## 2014-10-10 DIAGNOSIS — M797 Fibromyalgia: Secondary | ICD-10-CM

## 2014-10-10 DIAGNOSIS — Z13220 Encounter for screening for lipoid disorders: Secondary | ICD-10-CM | POA: Diagnosis not present

## 2014-10-10 MED ORDER — LEVOTHYROXINE SODIUM 112 MCG PO TABS: 112.0000 ug | ORAL_TABLET | Freq: Every day | ORAL | Status: DC

## 2014-10-10 MED ORDER — CYCLOBENZAPRINE HCL 10 MG PO TABS: 5.0000 mg | ORAL_TABLET | Freq: Two times a day (BID) | ORAL | Status: DC | PRN

## 2014-10-10 NOTE — Progress Notes (Signed)
   Subjective:    Patient ID: Kiara Clayton, female    DOB: 10/29/1940, 74 y.o.   MRN: [id number]  HPI Follow-up hypothyroidism-she's currently taking levothyroxin 125 mc daily. She had her thyroid level checked at [facility] on March 11. Her TSH was 0.4 which is a little too low. She has noticed some changes in her hair. In fact she said that's how she knew that her thyroid was off. No recent weight changes or changes in energy level.  Would like to have a new rx for her muscle relaxer. Says when  stopped them her legs and arms have been cramping.  To the point where she's actually had to use a cane to walk. She still had some old Flexeril leftover inserted to take it and felt much better and was able to not use the cane again. She says she knows it can be habit-forming but really it makes a big difference in her pain level.  Review of Systems     Objective:   Physical Exam  Constitutional: She is oriented to person, place, and time. She appears well-developed and well-nourished.  HENT:  Head: Normocephalic and atraumatic.  Cardiovascular: Normal rate, regular rhythm and normal heart sounds.   Pulmonary/Chest: Effort normal and breath sounds normal.  Neurological: She is alert and oriented to person, place, and time.  Skin: Skin is warm and dry.  Psychiatric: She has a normal mood and affect. Her behavior is normal.          Assessment & Plan:  Hypothyroidism - decrease dose to 112 mcg daily. I think she physically feels well the only significant change she has notices in her hair.  REcheck level in 6 weeks.    Fibromyalgia/Muscle relaxer  - Cramps - encouraged exercise and stretches. Discussing points of really stretching out the muscle tissue daily and this can help reduce cramping. I did go ahead and refill the Flexeril. Though I suspect her insurance may not cover it may need to switch to his Zanaflex or something else.  Rheumatoid arthritis-she is now seeing  Dr. Gaylord Shih at [facility]. She is going to start her on Plaquenil. In fact she's going to pick up the prescription today. She did check with her eye doctor to make sure that this is going to be safe.

## 2014-10-25 ENCOUNTER — Encounter: Payer: Self-pay | Admitting: Family Medicine

## 2014-11-01 IMAGING — CR DG KNEE COMPLETE 4+V*L*
3 series · 3 of 3 positions shown · non-contrast
Comparison: None.

CLINICAL DATA: Bilateral knee pain.  Rheumatoid arthritis.

EXAM:
LEFT KNEE - COMPLETE 4+ VIEW

[view not recorded (1 of 3)]
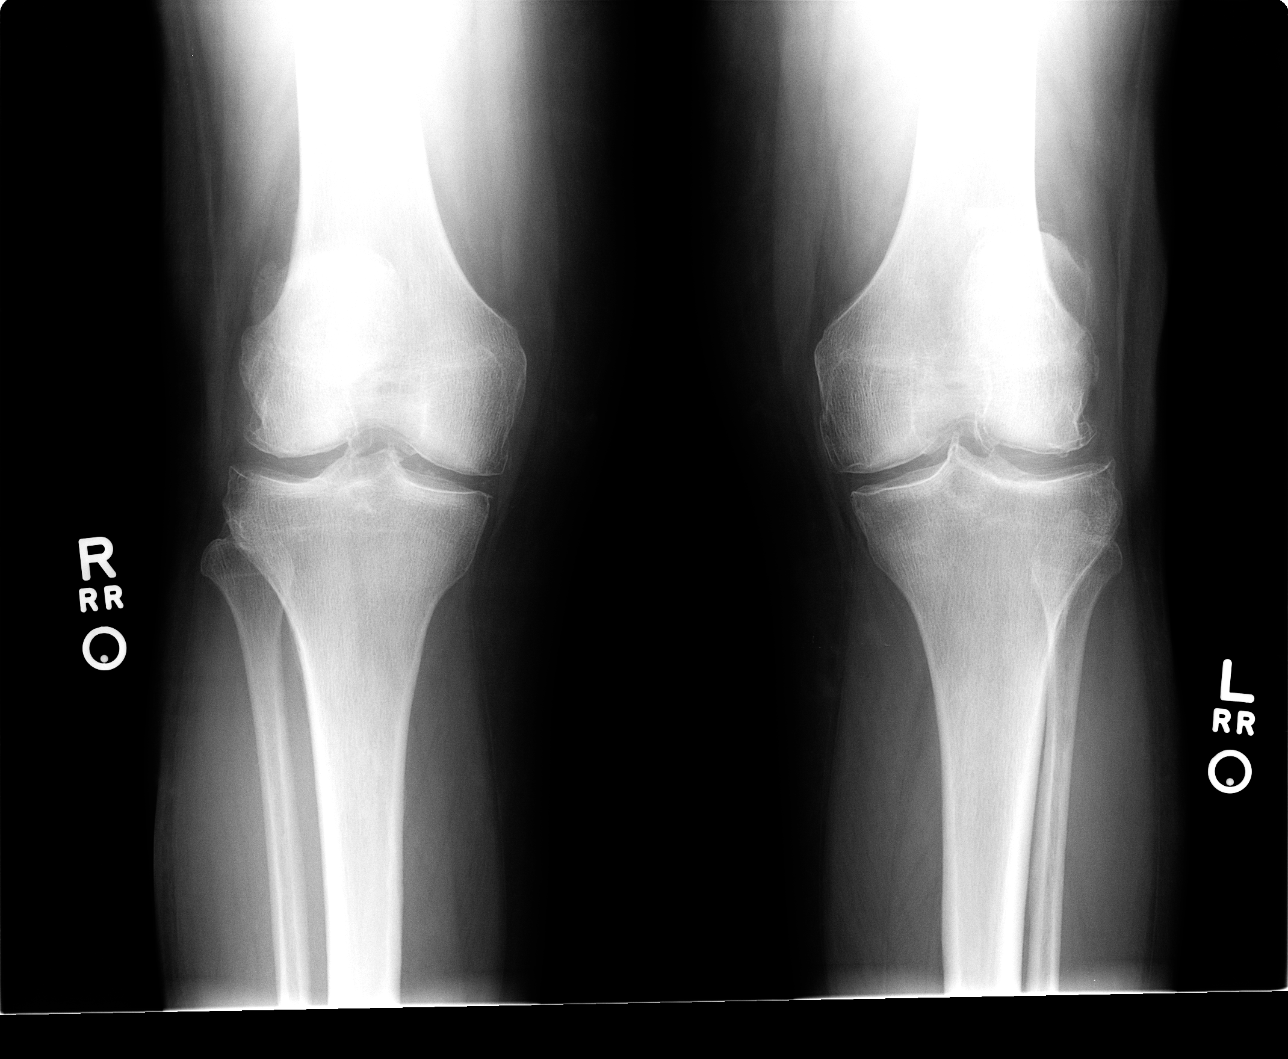

[view not recorded (2 of 3)]
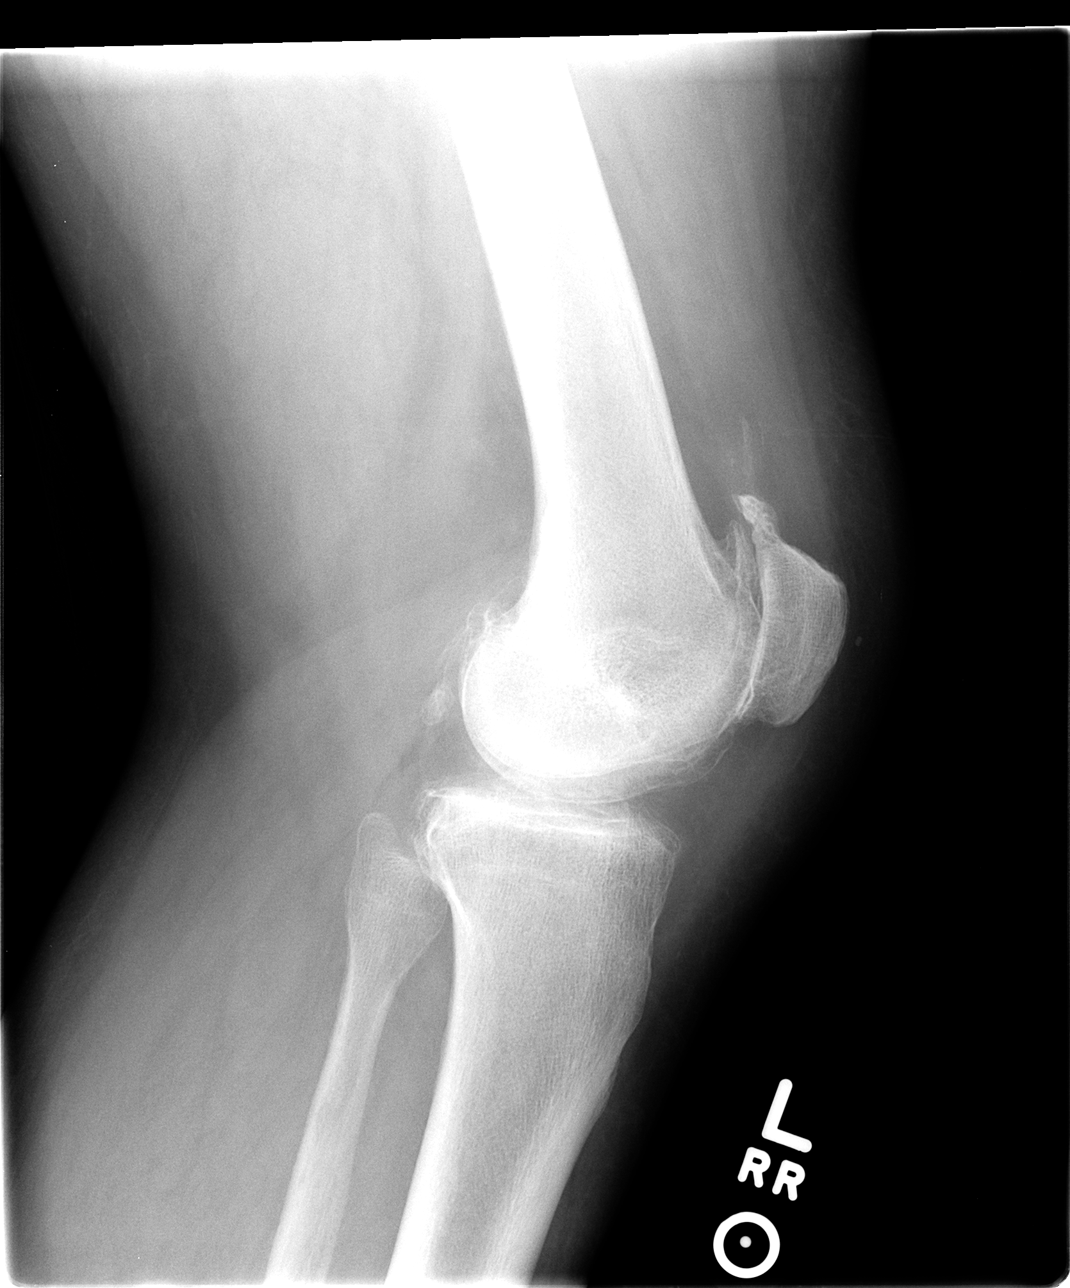

[view not recorded (3 of 3)]
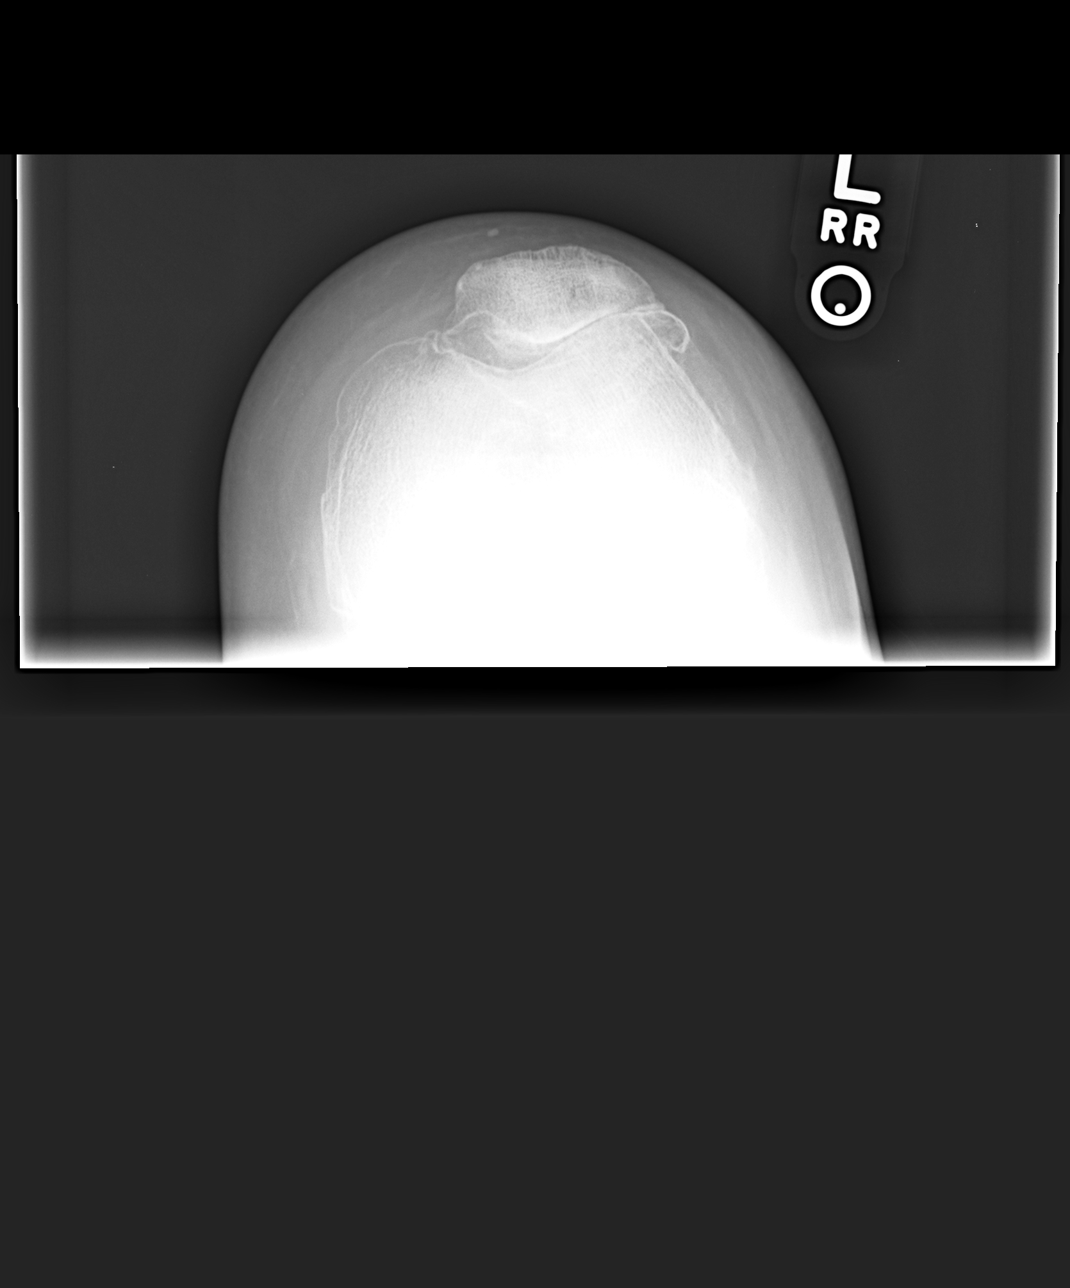

[3 of 3 positions shown; findings below may reference images not displayed]

FINDINGS: Severe patellofemoral degeneration with loss of cartilage along the
lateral patella and diffuse patellar spurring. Mild medial and
lateral Joint Space spurring without significant joint space
narrowing on the standing view. Small joint effusion. Negative for
fracture. Soft tissue calcification posterior to the joint
compatible with a fabella
IMPRESSION: Severe degenerative change the patellofemoral joint. Mild
degenerative change medially and laterally. No acute abnormality.

## 2014-11-26 ENCOUNTER — Ambulatory Visit: Payer: Medicare Other | Admitting: Family Medicine

## 2014-12-06 ENCOUNTER — Encounter: Payer: Self-pay | Admitting: Family Medicine

## 2014-12-06 ENCOUNTER — Ambulatory Visit (INDEPENDENT_AMBULATORY_CARE_PROVIDER_SITE_OTHER): Payer: Medicare Other | Admitting: Family Medicine

## 2014-12-06 VITALS — BP 151/80 | HR 102 | Wt 189.0 lb

## 2014-12-06 DIAGNOSIS — E038 Other specified hypothyroidism: Secondary | ICD-10-CM

## 2014-12-06 DIAGNOSIS — Z23 Encounter for immunization: Secondary | ICD-10-CM

## 2014-12-06 DIAGNOSIS — M069 Rheumatoid arthritis, unspecified: Secondary | ICD-10-CM | POA: Diagnosis not present

## 2014-12-06 NOTE — Progress Notes (Signed)
   Subjective:    Patient ID: Kiara Clayton, female    DOB: 01-03-1941, 74 y.o.   MRN: [id number]  HPI Hypothyroidism-we adjusted her dose the last time I saw her. Today is her 6 week follow-up. She noticed some changes in her hair but that was about the only thing. She has had a ST. No painful swallowing.   Rheumatoid arthritis-last saw her 6 weeks ago she was getting ready to start on Plaquenil with her new rheumatologist at [facility]. She has had a little consitpation as well.    Review of Systems     Objective:   Physical Exam  Constitutional: She is oriented to person, place, and time. She appears well-developed and well-nourished.  HENT:  Head: Normocephalic and atraumatic.  Right Ear: External ear normal.  Left Ear: External ear normal.  Nose: Nose normal.  Mouth/Throat: Oropharynx is clear and moist.  Mouth piece in place to close palate defect. OP with some cobblestoning but no erythema.   Eyes: Conjunctivae and EOM are normal. Pupils are equal, round, and reactive to light.  Neck: Neck supple. No thyromegaly present.  Cardiovascular: Normal rate, regular rhythm and normal heart sounds.   Pulmonary/Chest: Effort normal and breath sounds normal. She has no wheezes.  Lymphadenopathy:    She has no cervical adenopathy.  Neurological: She is alert and oriented to person, place, and time.  Skin: Skin is warm and dry.  Psychiatric: She has a normal mood and affect.          Assessment & Plan:  Hypothyroidism-due to recheck TSH. If the levels look great. Otherwise follow-up in 3 months.  RA - now on Plaquenil and doing well. Has a follow-up scheduled with rheumatology.  Due for screening lipids. Next  Prevnar 13 given today.

## 2014-12-06 NOTE — Addendum Note (Signed)
Addended by: Darla Lesches T on: 12/06/2014 01:24 PM   Modules accepted: Orders

## 2014-12-07 ENCOUNTER — Encounter: Payer: Self-pay | Admitting: Family Medicine

## 2014-12-07 LAB — TSH: TSH: 1.427 u[IU]/mL (ref 0.350–4.500)

## 2014-12-07 LAB — LIPID PANEL
Cholesterol: 195 mg/dL (ref 0–200)
HDL: 61 mg/dL (ref >=46)
LDL Cholesterol: 109 mg/dL — ABNORMAL HIGH (ref 0–99)
TRIGLYCERIDES: 125 mg/dL (ref <150)
Total CHOL/HDL Ratio: 3.2 Ratio
VLDL: 25 mg/dL (ref 0–40)

## 2014-12-13 ENCOUNTER — Ambulatory Visit: Payer: Medicare Other | Admitting: Family Medicine

## 2014-12-19 ENCOUNTER — Other Ambulatory Visit: Payer: Self-pay | Admitting: Family Medicine

## 2015-01-08 ENCOUNTER — Other Ambulatory Visit: Payer: Self-pay | Admitting: Family Medicine

## 2015-03-08 ENCOUNTER — Encounter: Payer: Self-pay | Admitting: Family Medicine

## 2015-03-08 ENCOUNTER — Ambulatory Visit (INDEPENDENT_AMBULATORY_CARE_PROVIDER_SITE_OTHER): Payer: Medicare Other | Admitting: Family Medicine

## 2015-03-08 VITALS — BP 138/80 | HR 97 | Wt 187.0 lb

## 2015-03-08 DIAGNOSIS — H9202 Otalgia, left ear: Secondary | ICD-10-CM | POA: Diagnosis not present

## 2015-03-08 DIAGNOSIS — E038 Other specified hypothyroidism: Secondary | ICD-10-CM

## 2015-03-08 DIAGNOSIS — I10 Essential (primary) hypertension: Secondary | ICD-10-CM

## 2015-03-08 DIAGNOSIS — R7301 Impaired fasting glucose: Secondary | ICD-10-CM | POA: Diagnosis not present

## 2015-03-08 MED ORDER — CYCLOBENZAPRINE HCL 10 MG PO TABS: 5.0000 mg | ORAL_TABLET | Freq: Two times a day (BID) | ORAL | Status: DC | PRN

## 2015-03-08 MED ORDER — LEVOTHYROXINE SODIUM 112 MCG PO TABS: 112.0000 ug | ORAL_TABLET | Freq: Every day | ORAL | Status: DC

## 2015-03-08 NOTE — Progress Notes (Signed)
   Subjective:    Patient ID: Kiara Clayton, female    DOB: [birth year], 74 y.o.   MRN: [id number]  HPI Hypothyroidism-currently on levothyroxine 112 g daily and doing well without any side effects or problems. No recent skin or hair changes. No recent changes in weight.  Hypertension- Pt denies chest pain, SOB, dizziness, or heart palpitations.  Taking meds as directed w/o problems.  Denies medication side effects.    She does want me to look in her left ear today. Says feels like something "growing in it".  No drainage. No fever or chills.  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  Review of Systems     Objective:   Physical Exam  Constitutional: She is oriented to person, place, and time. She appears well-developed and well-nourished.  HENT:  Head: Normocephalic and atraumatic.  Right Ear: External ear normal.  Left Ear: External ear normal.  Nose: Nose normal.  TMs and canals are clear.   Eyes: Conjunctivae and EOM are normal. Pupils are equal, round, and reactive to light.  Neck: Neck supple. No thyromegaly present.  Cardiovascular: Normal rate, regular rhythm and normal heart sounds.   Pulmonary/Chest: Effort normal and breath sounds normal. She has no wheezes.  Lymphadenopathy:    She has no cervical adenopathy.  Neurological: She is alert and oriented to person, place, and time.  Skin: Skin is warm and dry.  Psychiatric: She has a normal mood and affect. Her behavior is normal.          Assessment & Plan:  Hypothyroidism - recheck thyroid level. It looks great and repeat again in 6 months. Exam is normal. And she is asymptomatic. 90 day supply sent to the pharmacy.  HTN  - blood pressure at goal today. Not on any medications at this time. We'll continue to monitor carefully. If it elevates again then we will need to restart her blood pressure medication. Follow-up in 6 months.  Left ear  pain - ear exam normal.  Could be pressure from allergies. She is taking over-the-counter and a histamine. If it gets worse or she develops a fever then please come back in.

## 2015-03-09 LAB — TSH: TSH: 1.917 u[IU]/mL (ref 0.350–4.500)

## 2015-07-23 ENCOUNTER — Other Ambulatory Visit: Payer: Self-pay | Admitting: Family Medicine

## 2015-07-26 ENCOUNTER — Other Ambulatory Visit: Payer: Self-pay

## 2015-07-26 MED ORDER — LEVOTHYROXINE SODIUM 112 MCG PO TABS: 112.0000 ug | ORAL_TABLET | Freq: Every day | ORAL | Status: DC

## 2015-09-05 ENCOUNTER — Other Ambulatory Visit: Payer: Self-pay | Admitting: Family Medicine

## 2015-09-05 DIAGNOSIS — E038 Other specified hypothyroidism: Secondary | ICD-10-CM

## 2015-09-05 LAB — TSH: TSH: 6.46 mIU/L — ABNORMAL HIGH

## 2015-09-06 ENCOUNTER — Encounter: Payer: Self-pay | Admitting: Family Medicine

## 2015-09-12 ENCOUNTER — Ambulatory Visit (INDEPENDENT_AMBULATORY_CARE_PROVIDER_SITE_OTHER): Payer: Medicare Other | Admitting: Family Medicine

## 2015-09-12 ENCOUNTER — Encounter: Payer: Self-pay | Admitting: Family Medicine

## 2015-09-12 ENCOUNTER — Other Ambulatory Visit: Payer: Self-pay | Admitting: Family Medicine

## 2015-09-12 VITALS — BP 130/74 | HR 79 | Wt 195.0 lb

## 2015-09-12 DIAGNOSIS — E038 Other specified hypothyroidism: Secondary | ICD-10-CM | POA: Diagnosis not present

## 2015-09-12 DIAGNOSIS — Z1231 Encounter for screening mammogram for malignant neoplasm of breast: Secondary | ICD-10-CM

## 2015-09-12 DIAGNOSIS — R030 Elevated blood-pressure reading, without diagnosis of hypertension: Secondary | ICD-10-CM

## 2015-09-12 DIAGNOSIS — R7301 Impaired fasting glucose: Secondary | ICD-10-CM | POA: Diagnosis not present

## 2015-09-12 DIAGNOSIS — I10 Essential (primary) hypertension: Secondary | ICD-10-CM

## 2015-09-12 DIAGNOSIS — IMO0001 Reserved for inherently not codable concepts without codable children: Secondary | ICD-10-CM

## 2015-09-12 LAB — POCT GLYCOSYLATED HEMOGLOBIN (HGB A1C): Hemoglobin A1C: 5.7

## 2015-09-12 MED ORDER — LEVOTHYROXINE SODIUM 125 MCG PO TABS: 125.0000 ug | ORAL_TABLET | Freq: Every day | ORAL | Status: DC

## 2015-09-12 NOTE — Progress Notes (Signed)
   Subjective:    Patient ID: Kiara Clayton, female    DOB: May 08, 1941, 75 y.o.   MRN: [id number]  HPI Hypothyroid - she says she is taking her medication daily about 30 minutes before her first meal a day. She says occasionally gets a little closer to breakfast but overall she tries to be consistent she has not missed any doses. No change in hair or skin or weight. No side effects with the medication. She is here to review her lab results.   IFG - no increased thirst or urination.   Review of Systems     Objective:   Physical Exam  Constitutional: She is oriented to person, place, and time. She appears well-developed and well-nourished.  HENT:  Head: Normocephalic and atraumatic.  Cardiovascular: Normal rate, regular rhythm and normal heart sounds.   Pulmonary/Chest: Effort normal and breath sounds normal.  Neurological: She is alert and oriented to person, place, and time.  Skin: Skin is warm and dry.  Psychiatric: She has a normal mood and affect. Her behavior is normal.          Assessment & Plan:  Hypothyroid- her TSH was elevated. inc dose to 129mcg daily. Recheck labs in 6-8 weeks.   Elevated BP.  - well controlled on current regimen. Follow-up in 6 months.  IFG - hemoglobin A1c is stable. Continue work on diet and exercise and recheck in 6 months. Lab Results  Component Value Date   HGBA1C 5.7 09/12/2015

## 2015-09-12 NOTE — Patient Instructions (Signed)
We are increasing her dose to 125 mcg daily for your thyroid medication.   Let recheck your blood work, TSH, in about 6-8 weeks.

## 2015-09-18 ENCOUNTER — Ambulatory Visit (INDEPENDENT_AMBULATORY_CARE_PROVIDER_SITE_OTHER): Payer: Medicare Other

## 2015-09-18 DIAGNOSIS — Z1231 Encounter for screening mammogram for malignant neoplasm of breast: Secondary | ICD-10-CM | POA: Diagnosis not present

## 2015-09-20 ENCOUNTER — Encounter: Payer: Self-pay | Admitting: *Deleted

## 2015-10-23 DIAGNOSIS — Z961 Presence of intraocular lens: Secondary | ICD-10-CM | POA: Diagnosis not present

## 2015-10-23 DIAGNOSIS — H40003 Preglaucoma, unspecified, bilateral: Secondary | ICD-10-CM | POA: Diagnosis not present

## 2015-10-23 DIAGNOSIS — H26493 Other secondary cataract, bilateral: Secondary | ICD-10-CM | POA: Diagnosis not present

## 2015-10-23 DIAGNOSIS — H43813 Vitreous degeneration, bilateral: Secondary | ICD-10-CM | POA: Diagnosis not present

## 2015-10-23 DIAGNOSIS — H527 Unspecified disorder of refraction: Secondary | ICD-10-CM | POA: Diagnosis not present

## 2015-12-10 ENCOUNTER — Ambulatory Visit (INDEPENDENT_AMBULATORY_CARE_PROVIDER_SITE_OTHER): Payer: Medicare Other | Admitting: Family Medicine

## 2015-12-10 ENCOUNTER — Encounter: Payer: Self-pay | Admitting: Family Medicine

## 2015-12-10 VITALS — BP 135/63 | HR 90 | Wt 196.0 lb

## 2015-12-10 DIAGNOSIS — I10 Essential (primary) hypertension: Secondary | ICD-10-CM

## 2015-12-10 DIAGNOSIS — E038 Other specified hypothyroidism: Secondary | ICD-10-CM

## 2015-12-10 MED ORDER — AMBULATORY NON FORMULARY MEDICATION: Status: DC

## 2015-12-10 MED ORDER — LEVOTHYROXINE SODIUM 125 MCG PO TABS: 125.0000 ug | ORAL_TABLET | Freq: Every day | ORAL | Status: DC

## 2015-12-10 NOTE — Progress Notes (Signed)
Subjective:    CC: thyroid  HPI:  Hypothyroidism-three-month follow-up. We increased her dose 3 months ago. She is due for repeat lab work today. Next  Hypertension- Pt denies chest pain, SOB, dizziness, or heart palpitations.  Taking meds as directed w/o problems.  Denies medication side effects.  She is a little frustrated because she is actually gained some weight but was only a pounds since I last saw her. She says she's actually been trying to eat better and exercise some but she really can't walk a lot because of her knee problems.   Past medical history, Surgical history, Family history not pertinant except as noted below, Social history, Allergies, and medications have been entered into the medical record, reviewed, and corrections made.   Review of Systems: No fevers, chills, night sweats, weight loss, chest pain, or shortness of breath.   Objective:    General: Well Developed, well nourished, and in no acute distress.  Neuro: Alert and oriented x3, extra-ocular muscles intact, sensation grossly intact.  HEENT: Normocephalic, atraumatic  Skin: Warm and dry, no rashes. Cardiac: Regular rate and rhythm, no murmurs rubs or gallops, no lower extremity edema.  Respiratory: Clear to auscultation bilaterally. Not using accessory muscles, speaking in full sentences.   Impression and Recommendations:   Hypothyroidism-due to recheck TSH. Adjust dose if needed. I will be to follow-up in 3 months if we need to adjust her dose or 6 months if it is normal.  Hypertension-well-controlled. Continue current regimen. Follow-up in 6 months. Continue work on diet and exercise.

## 2015-12-11 ENCOUNTER — Other Ambulatory Visit: Payer: Self-pay | Admitting: Family Medicine

## 2015-12-11 DIAGNOSIS — E038 Other specified hypothyroidism: Secondary | ICD-10-CM | POA: Diagnosis not present

## 2015-12-11 DIAGNOSIS — I10 Essential (primary) hypertension: Secondary | ICD-10-CM | POA: Diagnosis not present

## 2015-12-11 LAB — LIPID PANEL
CHOL/HDL RATIO: 3.2 ratio (ref <=5.0)
Cholesterol: 207 mg/dL — ABNORMAL HIGH (ref 125–200)
HDL: 64 mg/dL (ref >=46)
LDL Cholesterol: 124 mg/dL (ref <130)
Triglycerides: 96 mg/dL (ref <150)
VLDL: 19 mg/dL (ref <30)

## 2015-12-11 LAB — TSH: TSH: 3.05 m[IU]/L

## 2015-12-11 MED ORDER — LEVOTHYROXINE SODIUM 125 MCG PO TABS: 125.0000 ug | ORAL_TABLET | Freq: Every day | ORAL | Status: DC

## 2016-01-06 DIAGNOSIS — Z79899 Other long term (current) drug therapy: Secondary | ICD-10-CM | POA: Diagnosis not present

## 2016-01-06 DIAGNOSIS — M0609 Rheumatoid arthritis without rheumatoid factor, multiple sites: Secondary | ICD-10-CM | POA: Diagnosis not present

## 2016-03-12 ENCOUNTER — Other Ambulatory Visit: Payer: Self-pay | Admitting: *Deleted

## 2016-03-12 DIAGNOSIS — R7301 Impaired fasting glucose: Secondary | ICD-10-CM | POA: Diagnosis not present

## 2016-03-12 DIAGNOSIS — E038 Other specified hypothyroidism: Secondary | ICD-10-CM

## 2016-03-12 LAB — TSH: TSH: 3.12 m[IU]/L

## 2016-03-12 MED ORDER — CYCLOBENZAPRINE HCL 10 MG PO TABS: 5.0000 mg | ORAL_TABLET | Freq: Two times a day (BID) | ORAL | 1 refills | Status: DC | PRN

## 2016-03-13 LAB — HEMOGLOBIN A1C
Hgb A1c MFr Bld: 5.8 % — ABNORMAL HIGH (ref <5.7)
MEAN PLASMA GLUCOSE: 120 mg/dL

## 2016-04-10 ENCOUNTER — Ambulatory Visit (INDEPENDENT_AMBULATORY_CARE_PROVIDER_SITE_OTHER): Payer: Medicare Other | Admitting: Family Medicine

## 2016-04-10 ENCOUNTER — Encounter: Payer: Self-pay | Admitting: Family Medicine

## 2016-04-10 VITALS — BP 138/86 | HR 97 | Ht 65.0 in | Wt 198.0 lb

## 2016-04-10 DIAGNOSIS — I10 Essential (primary) hypertension: Secondary | ICD-10-CM | POA: Diagnosis not present

## 2016-04-10 DIAGNOSIS — E038 Other specified hypothyroidism: Secondary | ICD-10-CM | POA: Diagnosis not present

## 2016-04-10 DIAGNOSIS — R0683 Snoring: Secondary | ICD-10-CM | POA: Diagnosis not present

## 2016-04-10 MED ORDER — LEVOTHYROXINE SODIUM 125 MCG PO TABS: 125.0000 ug | ORAL_TABLET | Freq: Every day | ORAL | 1 refills | Status: DC

## 2016-04-10 NOTE — Progress Notes (Signed)
   Subjective:    Patient ID: Kiara Clayton, female    DOB: Oct 05, 1940, 75 y.o.   MRN: [id number]  HPI Hypothyroid - doing well overall.  No recent changes. Last TSH at goal.  No recent changes to weight  Or energy level. No skin or hair changes.    Hypertension- Pt denies chest pain, SOB, dizziness, or heart palpitations.  Taking meds as directed w/o problems.  Denies medication side effects.    She is still not sleeping well and would like to have a sleep study for sleep apnea. Says she was tested before but not sure where.   Review of Systems     Objective:   Physical Exam  Constitutional: She is oriented to person, place, and time. She appears well-developed and well-nourished.  HENT:  Head: Normocephalic and atraumatic.  Neck: Neck supple. No thyromegaly present.  Cardiovascular: Normal rate, regular rhythm and normal heart sounds.   Pulmonary/Chest: Effort normal and breath sounds normal.  Musculoskeletal: She exhibits no edema.  Lymphadenopathy:    She has no cervical adenopathy.  Neurological: She is alert and oriented to person, place, and time.  Skin: Skin is warm and dry. No rash noted.  Psychiatric: She has a normal mood and affect. Her behavior is normal.       Assessment & Plan:  HTN - Well controlled. Continue current regimen. Follow up in  6 mo.   Hypothyroid.  Last level looked great. I'll just see her back in 6 months and we'll recheck it at that time.  Snoring - stop bang questionnaire score of 3 which does put her into the high risk category. Recommend referral for sleep apnea.

## 2016-05-25 ENCOUNTER — Encounter: Payer: Self-pay | Admitting: Sports Medicine

## 2016-05-25 ENCOUNTER — Ambulatory Visit (INDEPENDENT_AMBULATORY_CARE_PROVIDER_SITE_OTHER): Payer: Medicare Other

## 2016-05-25 ENCOUNTER — Ambulatory Visit (INDEPENDENT_AMBULATORY_CARE_PROVIDER_SITE_OTHER): Payer: Medicare Other | Admitting: Sports Medicine

## 2016-05-25 DIAGNOSIS — M4802 Spinal stenosis, cervical region: Secondary | ICD-10-CM

## 2016-05-25 DIAGNOSIS — M542 Cervicalgia: Secondary | ICD-10-CM

## 2016-05-25 DIAGNOSIS — M47812 Spondylosis without myelopathy or radiculopathy, cervical region: Secondary | ICD-10-CM | POA: Diagnosis not present

## 2016-05-25 MED ORDER — PREDNISONE 50 MG PO TABS: ORAL_TABLET | ORAL | 0 refills | Status: DC

## 2016-05-25 NOTE — Assessment & Plan Note (Addendum)
Suspect cervical spasm. Pain is all axial without radiculopathy. X-rays and with history of rheumatoid arthritis we will include flexion and extension views, prednisone, continue Flexeril. Home rehabilitation exercises.

## 2016-05-25 NOTE — Progress Notes (Signed)
   Subjective:    I'm seeing this patient as a consultation for: Dr. Beatrice Lecher   CC: Neck pain  HPI: This is a 75 year old female, comes in with a several day history of increasing pain in her neck with difficulty turning to the left and right, pain is all in the neck, without radiation down the arm, no trauma. History of rheumatoid arthritis. No lower extremity weakness.  Past medical history:  Negative.  See flowsheet/record as well for more information.  Surgical history: Negative.  See flowsheet/record as well for more information.  Family history: Negative.  See flowsheet/record as well for more information.  Social history: Negative.  See flowsheet/record as well for more information.  Allergies, and medications have been entered into the medical record, reviewed, and no changes needed.   Review of Systems: No headache, visual changes, nausea, vomiting, diarrhea, constipation, dizziness, abdominal pain, skin rash, fevers, chills, night sweats, weight loss, swollen lymph nodes, body aches, joint swelling, muscle aches, chest pain, shortness of breath, mood changes, visual or auditory hallucinations.   Objective:   General: Well Developed, well nourished, and in no acute distress.  Neuro/Psych: Alert and oriented x3, extra-ocular muscles intact, able to move all 4 extremities, sensation grossly intact. Skin: Warm and dry, no rashes noted.  Respiratory: Not using accessory muscles, speaking in full sentences, trachea midline.  Cardiovascular: Pulses palpable, no extremity edema. Abdomen: Does not appear distended. Neck: Negative spurling's Range of motion limited to about 20 of left and right rotation Grip strength and sensation normal in bilateral hands Strength good C4 to T1 distribution No sensory change to C4 to T1 Reflexes normal  X-rays personally reviewed, there is severe C5-C6 and C6-C7 degenerative disc disease  Impression and Recommendations:   This case  required medical decision making of moderate complexity.  Neck pain Suspect cervical spasm. Pain is all axial without radiculopathy. X-rays and with history of rheumatoid arthritis we will include flexion and extension views, prednisone, continue Flexeril. Home rehabilitation exercises.

## 2016-05-28 ENCOUNTER — Encounter: Payer: Self-pay | Admitting: Family Medicine

## 2016-05-28 ENCOUNTER — Ambulatory Visit (INDEPENDENT_AMBULATORY_CARE_PROVIDER_SITE_OTHER): Payer: Medicare Other | Admitting: Family Medicine

## 2016-05-28 VITALS — BP 145/68 | HR 94 | Ht 65.0 in | Wt 192.0 lb

## 2016-05-28 DIAGNOSIS — R200 Anesthesia of skin: Secondary | ICD-10-CM

## 2016-05-28 DIAGNOSIS — M25562 Pain in left knee: Secondary | ICD-10-CM

## 2016-05-28 DIAGNOSIS — M7061 Trochanteric bursitis, right hip: Secondary | ICD-10-CM | POA: Diagnosis not present

## 2016-05-28 DIAGNOSIS — R29898 Other symptoms and signs involving the musculoskeletal system: Secondary | ICD-10-CM

## 2016-05-28 DIAGNOSIS — M542 Cervicalgia: Secondary | ICD-10-CM

## 2016-05-28 NOTE — Progress Notes (Signed)
Subjective:    CC: facial numbness  HPI:  75 yo with a hx of RA.  Sees Dr. Barkley Boards.    Having some grabbing, sharp pain in her left knee .  Says it Bothers her in the morning when she first gets up and moves her leg. She'll get some achiness around the kneecap area but then is seen if she starts getting out of bed and walking then she'll get sharp grabbing pain on the inside of her knee. 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  Having some soreness on her left side under the axilla.  She says the pain comes and goes. It's not necessarily related to laying on that side or heavy lifting etc. She said achiness at times.  Having pain in her right hip. That has bothered her on and off for years. She's had bursitis in the hip before. She's currently on oral prednisone and has not for about 4 days. She says it actually feels better today.  She's also very concerned because she's been having some numbness in her face. It started about 3 days ago. Mostly underneath her eye and over the right upper lip. Occasionally she feels like it's going on the right side down onto her chin. It does not radiate towards her ear or up onto the scalp. She says she feels like eating and chewing triggers it.  She says that today she was trying to do the exercises that Dr. Dianah Field have given her for her neck and was on the floor and could not get up. She suddenly felt extremely weak in her lower extremities and actually had to hit her medical alert button to get someone to help her up off the floor. She did not fall. Later that day is when she noticed the numbness in her lip.   Past medical history, Surgical history, Family history not pertinant except as noted below, Social history, Allergies, and medications have been entered into the medical record, reviewed, and corrections made.   Review of Systems: No fevers, chills, night sweats, weight loss, chest pain, or shortness of breath.   Objective:    General:  Well Developed, well nourished, and in no acute distress.  Neuro: Alert and oriented x3, extra-ocular muscles intact, sensation grossly intact.  HEENT: Normocephalic, atraumatic  Skin: Warm and dry, no rashes. Cardiac: Regular rate and rhythm, no murmurs rubs or gallops, no lower extremity edema.  Respiratory: Clear to auscultation bilaterally. Not using accessory muscles, speaking in full sentences. Musculoskeletal: Right hip with normal flexion and extension. No significant discomfort with internal or external rotation.Tender over the greater trochanter. Left knee with normal flexion extension. No increased laxity with anterior posterior drawer. Negative McMurray's. No edema. She was tender along the medial joint line. Abd: soft nontender.     Impression and Recommendations:   Left knee pain-most consistent with osteoarthritis. She's currently on oral prednisone. We also discussed putting a pillow between the knee when she sleeps at night. Might benefit from a corticosteroid injection. Could see Dr. Dianah Field back for this. Next  Right hip pain is consistent with bursitis. She's actually feeling better as far as that's concern today. Recommend exercises and consider injection in the future if pain recurs.  Facial numbness-worrisome for possible stroke. Recommend MRI for further evaluation. We try to get her scheduled today but the MRI truck was broken. 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 She reports that she has had a lot of  dental work recently.  Left axillary pain - unclear.  Not hurting her today. It comes and goes.  Unable to re-create her pain on exam today. No appreciable swelling. Could be musculoskeletal.

## 2016-05-30 ENCOUNTER — Ambulatory Visit (HOSPITAL_BASED_OUTPATIENT_CLINIC_OR_DEPARTMENT_OTHER)
Admission: RE | Admit: 2016-05-30 | Discharge: 2016-05-30 | Disposition: A | Discharge status: Home / Self Care | Payer: Medicare Other | Source: Ambulatory Visit | Attending: Family Medicine | Admitting: Family Medicine

## 2016-05-30 DIAGNOSIS — R9089 Other abnormal findings on diagnostic imaging of central nervous system: Secondary | ICD-10-CM | POA: Insufficient documentation

## 2016-05-30 DIAGNOSIS — R200 Anesthesia of skin: Secondary | ICD-10-CM | POA: Diagnosis not present

## 2016-05-30 DIAGNOSIS — R55 Syncope and collapse: Secondary | ICD-10-CM | POA: Diagnosis not present

## 2016-06-03 ENCOUNTER — Ambulatory Visit (INDEPENDENT_AMBULATORY_CARE_PROVIDER_SITE_OTHER): Payer: Medicare Other | Admitting: Physical Therapy

## 2016-06-03 ENCOUNTER — Encounter: Payer: Self-pay | Admitting: Physical Therapy

## 2016-06-03 DIAGNOSIS — M6281 Muscle weakness (generalized): Secondary | ICD-10-CM

## 2016-06-03 DIAGNOSIS — M542 Cervicalgia: Secondary | ICD-10-CM | POA: Diagnosis not present

## 2016-06-03 DIAGNOSIS — M25562 Pain in left knee: Secondary | ICD-10-CM

## 2016-06-03 NOTE — Patient Instructions (Addendum)
Heel Slide    Bend left knee and pull heel toward buttocks. Repeat ____ times. Do ____ sessions per day.  http://[email or web address].exer.us/300   Copyright  VHI. All rights reserved.  Straight Leg Raise    Bend one leg. Raise other leg ____ inches with knee locked. Exhale and tighten thigh muscles while raising leg. Repeat with other leg. Repeat ____ times. Do ____ sessions per day.  http://[email or web address].exer.us/269   Copyright  VHI. All rights reserved.  Abduction    Slide one leg out to side. Keep kneecap pointing up. Gently bring leg back to pillow. Repeat with other leg. Repeat ____ times. Do ____ sessions per day.  http://[email or web address].exer.us/373   Copyright  VHI. All rights reserved.  Axial Extension (Chin Tuck)    Gently pull chin in while lengthening back of neck. Hold ____ seconds while counting out loud. Repeat ____ times. Do ____ sessions per day.  http://[email or web address].exer.us/581   Copyright  VHI. All rights reserved.  Lateral Flexion    With head in comfortable, centered position and chin slightly tucked, gently bring right ear toward right shoulder. Hold ____ seconds. Repeat with left side. Repeat ____ times. Do ____ sessions per day.  http://[email or web address].exer.us/8   Copyright  VHI. All rights reserved.  Active Neck Rotation    With head in a comfortable position and chin gently tucked in, rotate head to the right. Hold ____ seconds. Repeat to the left. Repeat ____ times. Do ____ sessions per day.  http://[email or web address].exer.us/10   Copyright  VHI. All rights reserved.

## 2016-06-03 NOTE — Therapy (Signed)
[facility], [place], [address] Phone: [phone]   Fax:  [phone]  Physical Therapy Evaluation  Patient Details  Name: Kiara Clayton MRN: [id number] Date of Birth: [birth year] Referring Provider: Dianah Field  Encounter Date: 06/03/2016      PT End of Session - 06/03/16 1139    Visit Number 1   Date for PT Re-Evaluation 07/29/16   PT Start Time 1100   PT Stop Time 1148   PT Time Calculation (min) 48 min   Activity Tolerance Patient tolerated treatment well   Behavior During Therapy [facility] for tasks assessed/performed      Past Medical History:  Diagnosis Date  . Allergic rhinitis   . Asthma, chronic   . Diphtheria   . Essential hypertension, benign   . Fibromyalgia    See Dr. Charlestine Night (rhuem) in [place]. Uses Aleve and cyclobenzaprine and lyrica.  Cyclobenzaprine no longer covered as of 2015 by [facility].   Marland Kitchen Hearing loss   . Heart murmur   . Hypercalcemia   . Hypothyroid   . IFG (impaired fasting glucose)   . Palate deformity   . Post herpetic neuralgia    On Lyrica.  Sees Dr. Hurley Cisco (rheumatology).     . Rheumatoid arthritis ([place])   . Trochanteric bursitis of right hip     Past Surgical History:  Procedure Laterality Date  . BREAST BIOPSY  1988   Dr. Bubba Camp, no cancer.   Marland Kitchen [place]  . LUMBAR DISC SURGERY  1990   Dr. Verdene Lennert, [place]  . TONSILLECTOMY AND ADENOIDECTOMY     As a child   . TOTAL ABDOMINAL HYSTERECTOMY W/ BILATERAL SALPINGOOPHORECTOMY  1988    There were no vitals filed for this visit.       Subjective Assessment - 06/03/16 1108    Subjective Last week pt was on the floor performing neck exercises and could not get up off the floor. Now with Lt knee, Rt hip and neck pain.    Pertinent History lumbar fusion   Limitations Standing;Sitting   How long can you sit comfortably? 74mins-1hour   How long can you stand  comfortably? 5 minutes   Diagnostic tests MRI, not read yet   Patient Stated Goals reduce pain and return to normal activities   Currently in Pain? No/denies            [facility] PT Assessment - 06/03/16 0001      Assessment   Medical Diagnosis left knee pain, Rt hip trochantaric bursitis, neck pain   Referring Provider [email or web address]   Onset Date/Surgical Date 05/27/16     Precautions   Precautions None     Restrictions   Weight Bearing Restrictions No     Balance Screen   Has the patient fallen in the past 6 months No   Has the patient had a decrease in activity level because of a fear of falling?  Yes     Observation/Other Assessments   Observations mild edema Lt knee   Focus on Therapeutic Outcomes (FOTO)  66% limited (CL)     ROM / Strength   AROM / PROM / Strength AROM;Strength     AROM   Overall AROM Comments Rt knee ROM WFL   AROM Assessment Site Knee;Cervical   Right/Left Knee Left   Left Knee Extension 8   Left Knee Flexion 95   Cervical Flexion [facility]   Cervical  Extension neutral   Cervical - Right Side Bend limited 75%   Cervical - Left Side Bend limited 75%   Cervical - Right Rotation limited 25%   Cervical - Left Rotation limited 25%     Strength   Overall Strength Comments bilat hip and knee grossly 4-/5     Palpation   Spinal mobility cervical mm tightness bilat subocciptials and paraspinals, decreased mobility with PAs and rotation mobs C5-7   Palpation comment no ttp Lt greater trochanter     Special Tests    Special Tests Cervical   Cervical Tests Spurling's     Spurling's   Findings Negative   Side --  bilat     Bed Mobility   Bed Mobility --  pt requires assistance with supine to sit                   [facility] Adult PT Treatment/Exercise - 06/03/16 0001      Modalities   Modalities Moist Heat;Vasopneumatic     Moist Heat Therapy   Number Minutes Moist Heat 15 Minutes   Moist Heat Location Cervical     Vasopneumatic    Number Minutes Vasopneumatic  15 minutes   Vasopnuematic Location  Knee   Vasopneumatic Pressure Medium   Vasopneumatic Temperature  3 flakes                PT Education - 06/03/16 1136    Education provided Yes   Education Details PT POC, HEP   Person(s) Educated Patient   Methods Explanation;Demonstration;Handout   Comprehension Returned demonstration;Verbalized understanding          PT Short Term Goals - 06/03/16 1142      PT SHORT TERM GOAL #1   Title Pt will be independent in initial HEP   Time 4   Period Weeks   Status New     PT SHORT TERM GOAL #2   Title Pt will improve bilat LE strength to 4/5 to improve ability to stand without increased symptoms   Time 4   Period Weeks   Status New     PT SHORT TERM GOAL #3   Title Pt demo good form with cervical stretches to improve ROM   Time 4   Period Weeks   Status New           PT Long Term Goals - 06/03/16 1146      PT LONG TERM GOAL #1   Title Pt will be independent with advanced HEP   Time 8   Period Weeks   Status New     PT LONG TERM GOAL #2   Title Pt will improve FOTO to <=50% to demo improved mobility   Time 8   Period Weeks   Status New     PT LONG TERM GOAL #3   Title Pt will improve bilat LE strength to 4+/5 to tolerate increased activity without pain   Time 8   Period Weeks   Status New     PT LONG TERM GOAL #4   Title Pt will improve cervical sidebending and rotation ROM by 25%   Time 8   Period Weeks   Status New     PT LONG TERM GOAL #5   Title Pt will tolerate standing x 10 minutes without increase in symptoms   Time 8   Period Weeks   Status New               Plan -  06/03/16 1140    Clinical Impression Statement Pt with pain in neck and Lt knee limiting ability to perform daily activities without pain. pt with decreased LE strength and decreased cervical ROM. Pt will benefit from skilled PT to address deficits and improve functional mobility   Rehab  Potential Good   PT Frequency 2x / week   PT Duration 8 weeks   PT Treatment/Interventions ADLs/Self Care Home Management;Electrical Stimulation;Iontophoresis 4mg /ml Dexamethasone;Cryotherapy;Traction;Moist Heat;Therapeutic activities;Ultrasound;Patient/family education;Dry needling;Manual techniques;Functional mobility training;Therapeutic exercise;Balance training;Neuromuscular re-education;Vasopneumatic Device;Taping;Passive range of motion   PT Next Visit Plan Assess HEP, continue to assess most problematic areas, modalities and manual as indicated   PT Home Exercise Plan knee and cervical ROM and strength   Consulted and Agree with Plan of Care Patient      Patient will benefit from skilled therapeutic intervention in order to improve the following deficits and impairments:  Decreased activity tolerance, Decreased strength, Hypomobility, Increased edema, Decreased endurance, Decreased range of motion, Decreased mobility, Pain, Impaired flexibility  Visit Diagnosis: Acute pain of left knee - Plan: PT plan of care cert/re-cert  Cervicalgia - Plan: PT plan of care cert/re-cert  Muscle weakness (generalized) - Plan: PT plan of care cert/re-cert     Problem List Patient Active Problem List   Diagnosis Date Noted  . Neck pain 05/25/2016  . Bilateral leg pain 03/29/2014  . Mass of right hip region 03/13/2014  . Fatty liver disease, nonalcoholic 99991111  . Abnormal LFTs 11/08/2013  . Hypokalemia 10/05/2013  . Rheumatoid arthritis ([place]) 05/23/2013  . Trochanteric bursitis of right hip 04/15/2012  . Palate deformity 03/01/2012  . IFG (impaired fasting glucose) 02/03/2012  . Hypercalcemia 02/03/2012  . Hearing loss 12/10/2011  . Hypothyroid 10/17/2010  . Allergic rhinitis 10/17/2010  . Essential hypertension, benign 10/17/2010  . Fibromyalgia 10/17/2010  . Post herpetic neuralgia 10/17/2010  . Asthma, chronic 10/17/2010    Isabelle Course, PT, DPT 06/03/2016, 11:50  AM  [facility] [place], [place], [address] Phone: [phone]   Fax:  [phone]  Name: ALYLAH TRAMONTANA MRN: [id number] Date of Birth: [birth year]

## 2016-06-09 ENCOUNTER — Ambulatory Visit (INDEPENDENT_AMBULATORY_CARE_PROVIDER_SITE_OTHER): Payer: Medicare Other | Admitting: Rehabilitative and Restorative Service Providers"

## 2016-06-09 ENCOUNTER — Encounter: Payer: Self-pay | Admitting: Rehabilitative and Restorative Service Providers"

## 2016-06-09 DIAGNOSIS — M25562 Pain in left knee: Secondary | ICD-10-CM | POA: Diagnosis not present

## 2016-06-09 DIAGNOSIS — M6281 Muscle weakness (generalized): Secondary | ICD-10-CM

## 2016-06-09 DIAGNOSIS — M542 Cervicalgia: Secondary | ICD-10-CM

## 2016-06-09 NOTE — Therapy (Signed)
[place], [place], [address] Phone: [phone]   Fax:  [phone]  Physical Therapy Treatment  Patient Details  Name: Kiara Clayton MRN: [id number] Date of Birth: Apr 13, 1941 Referring Provider: Dianah Field  Encounter Date: 06/09/2016      PT End of Session - 06/09/16 1104    Visit Number 2   Number of Visits 16   Date for PT Re-Evaluation 07/29/16   PT Start Time 1100   PT Stop Time 1149   PT Time Calculation (min) 49 min   Activity Tolerance Patient tolerated treatment well      Past Medical History:  Diagnosis Date  . Allergic rhinitis   . Asthma, chronic   . Diphtheria   . Essential hypertension, benign   . Fibromyalgia    See Dr. Charlestine Night (rhuem) in [place]. Uses Aleve and cyclobenzaprine and lyrica.  Cyclobenzaprine no longer covered as of 2015 by [facility].   Marland Kitchen Hearing loss   . Heart murmur   . Hypercalcemia   . Hypothyroid   . IFG (impaired fasting glucose)   . Palate deformity   . Post herpetic neuralgia    On Lyrica.  Sees Dr. Hurley Cisco (rheumatology).     . Rheumatoid arthritis ([place])   . Trochanteric bursitis of right hip     Past Surgical History:  Procedure Laterality Date  . BREAST BIOPSY  1988   Dr. Bubba Camp, no cancer.   Marland Kitchen [place]  . LUMBAR DISC SURGERY  1990   Dr. Verdene Lennert, [place]  . TONSILLECTOMY AND ADENOIDECTOMY     As a child   . TOTAL ABDOMINAL HYSTERECTOMY W/ BILATERAL SALPINGOOPHORECTOMY  1988    There were no vitals filed for this visit.      Subjective Assessment - 06/09/16 1106    Subjective Continued pain in the neck and Lt knee mostly. Has had a bad week Sister in [place] died this week.    Currently in Pain? No/denies                         [facility] Adult PT Treatment/Exercise - 06/09/16 0001      Self-Care   Self-Care --  work on rolling supine to SL; SL to sit; sit to stand     Neck  Exercises: Standing   Neck Retraction 5 reps;10 secs   Other Standing Exercises scap squeeze with noodle 5 sec x 10      Knee/Hip Exercises: Stretches   Passive Hamstring Stretch 3 reps;30 seconds     Knee/Hip Exercises: Aerobic   Nustep L4 x 6 min slow pace      Knee/Hip Exercises: Seated   Sit to Sand 5 reps;without UE support  from hi low table in gym ~ 2.5 ft      Knee/Hip Exercises: Supine   Straight Leg Raises Right;Left;5 reps  3 sec hold      Neck Exercises: Stretches   Other Neck Stretches lateral cervical flexion 10 sec x 5 each side                PT Education - 06/09/16 1131    Education provided Yes   Education Details HEP   Person(s) Educated Patient   Methods Explanation;Demonstration;Tactile cues;Verbal cues;Handout   Comprehension Verbalized understanding;Returned demonstration;Verbal cues required;Tactile cues required          PT Short Term Goals - 06/09/16  1106      PT SHORT TERM GOAL #1   Title Pt will be independent in initial HEP   Time 4   Period Weeks   Status On-going     PT SHORT TERM GOAL #2   Title Pt will improve bilat LE strength to 4/5 to improve ability to stand without increased symptoms   Time 4   Period Weeks   Status On-going     PT SHORT TERM GOAL #3   Title Pt demo good form with cervical stretches to improve ROM   Time 4   Period Weeks   Status On-going           PT Long Term Goals - 06/09/16 1105      PT LONG TERM GOAL #1   Title Pt will be independent with advanced HEP   Time 8   Period Weeks   Status On-going     PT LONG TERM GOAL #2   Title Pt will improve FOTO to <=50% to demo improved mobility   Time 8   Period Weeks   Status On-going     PT LONG TERM GOAL #3   Title Pt will improve bilat LE strength to 4+/5 to tolerate increased activity without pain   Time 8   Period Weeks   Status On-going     PT LONG TERM GOAL #4   Title Pt will improve cervical sidebending and rotation ROM by 25%    Time 8   Period Weeks   Status On-going     PT LONG TERM GOAL #5   Title Pt will tolerate standing x 10 minutes without increase in symptoms   Time 8   Period Weeks   Status On-going               Plan - 06/09/16 1141    Clinical Impression Statement Patient tolerated exercise with some rest breaks today. She has difficulty with transitional movements and transfers.    Rehab Potential Good   PT Frequency 2x / week   PT Duration 8 weeks   PT Treatment/Interventions ADLs/Self Care Home Management;Electrical Stimulation;Iontophoresis 4mg /ml Dexamethasone;Cryotherapy;Traction;Moist Heat;Therapeutic activities;Ultrasound;Patient/family education;Dry needling;Manual techniques;Functional mobility training;Therapeutic exercise;Balance training;Neuromuscular re-education;Vasopneumatic Device;Taping;Passive range of motion   PT Next Visit Plan work on transfers and transitional movements; progress with ther ex; HEP; modalities and manual work as indicated   Oncologist with Plan of Care Patient      Patient will benefit from skilled therapeutic intervention in order to improve the following deficits and impairments:  Decreased activity tolerance, Decreased strength, Hypomobility, Increased edema, Decreased endurance, Decreased range of motion, Decreased mobility, Pain, Impaired flexibility  Visit Diagnosis: Acute pain of left knee  Cervicalgia  Muscle weakness (generalized)     Problem List Patient Active Problem List   Diagnosis Date Noted  . Neck pain 05/25/2016  . Bilateral leg pain 03/29/2014  . Mass of right hip region 03/13/2014  . Fatty liver disease, nonalcoholic 99991111  . Abnormal LFTs 11/08/2013  . Hypokalemia 10/05/2013  . Rheumatoid arthritis ([place]) 05/23/2013  . Trochanteric bursitis of right hip 04/15/2012  . Palate deformity 03/01/2012  . IFG (impaired fasting glucose) 02/03/2012  . Hypercalcemia 02/03/2012  . Hearing loss 12/10/2011  .  Hypothyroid 10/17/2010  . Allergic rhinitis 10/17/2010  . Essential hypertension, benign 10/17/2010  . Fibromyalgia 10/17/2010  . Post herpetic neuralgia 10/17/2010  . Asthma, chronic 10/17/2010    Brylee Mcgreal Nilda Simmer PT, MPH  06/09/2016, 11:44 AM  [facility] [name], [place], [address] Phone: [phone]   Fax:  [phone]  Name: ROYCE PAWLOWICZ MRN: [id number] Date of Birth: 1940-08-05

## 2016-06-09 NOTE — Patient Instructions (Signed)
Axial Extension ([place])    Pull chin in and lengthen back of neck. Hold _10___ seconds while counting out loud. Repeat __5__ times. Do __several__ sessions per day.   Shoulder Blade Squeeze    Rotate shoulders back, then squeeze shoulder blades down and back. Hold 10 sec Repeat __10__ times. Do _2-3___ sessions per day.   HIP: Hamstrings - Supine   Place strap around foot. Raise leg up, keeping knee straight.  Bend opposite knee to protect back if indicated. Hold 30 seconds. 3 reps per set, 2-3 sets per day   HIP: Flexion / KNEE: Extension, Straight Leg Raise    Raise leg, keeping knee straight. Perform slowly. _5-10__ reps per set, __1-2_ sets per day   SIT TO STAND: No Device    Sit with feet shoulder-width apart, on floor. Lean chest forward, raise hips up from surface. Straighten hips and knees. Weight bear equally on left and right sides. __5-10_ reps per set, _2__ sets per da

## 2016-06-15 ENCOUNTER — Encounter: Payer: Medicare Other | Admitting: Rehabilitative and Restorative Service Providers"

## 2016-06-18 ENCOUNTER — Ambulatory Visit (INDEPENDENT_AMBULATORY_CARE_PROVIDER_SITE_OTHER): Payer: Medicare Other | Admitting: Physical Therapy

## 2016-06-18 DIAGNOSIS — M6281 Muscle weakness (generalized): Secondary | ICD-10-CM | POA: Diagnosis not present

## 2016-06-18 DIAGNOSIS — M25562 Pain in left knee: Secondary | ICD-10-CM

## 2016-06-18 DIAGNOSIS — M542 Cervicalgia: Secondary | ICD-10-CM

## 2016-06-18 NOTE — Therapy (Signed)
[place], [place], [address] Phone: [phone]   Fax:  [phone]  Physical Therapy Treatment  Patient Details  Name: Kiara Clayton MRN: [id number] Date of Birth: [birth year] Referring Provider: Dr. Helane Rima  Encounter Date: 06/18/2016      PT End of Session - 06/18/16 1149    Visit Number 3   Number of Visits 16   Date for PT Re-Evaluation 07/29/16   PT Start Time 1105   PT Stop Time 1148   PT Time Calculation (min) 43 min   Activity Tolerance Patient tolerated treatment well      Past Medical History:  Diagnosis Date  . Allergic rhinitis   . Asthma, chronic   . Diphtheria   . Essential hypertension, benign   . Fibromyalgia    See Dr. Charlestine Night (rhuem) in [place]. Uses Aleve and cyclobenzaprine and lyrica.  [facility] no longer covered as of 2015 by [facility].   Marland Kitchen Hearing loss   . Heart murmur   . Hypercalcemia   . Hypothyroid   . IFG (impaired fasting glucose)   . Palate deformity   . Post herpetic neuralgia    On Lyrica.  Sees Dr. Hurley Cisco (rheumatology).     . Rheumatoid arthritis ([place])   . Trochanteric bursitis of right hip     Past Surgical History:  Procedure Laterality Date  . BREAST BIOPSY  1988   Dr. Bubba Camp, no cancer.   Marland Kitchen [place]  . LUMBAR DISC SURGERY  1990   Dr. Verdene Lennert, [place]  . TONSILLECTOMY AND ADENOIDECTOMY     As a child   . TOTAL ABDOMINAL HYSTERECTOMY W/ BILATERAL SALPINGOOPHORECTOMY  1988    There were no vitals filed for this visit.      Subjective Assessment - 06/18/16 1109    Subjective "My knees are a little better, but my low back is hurting".  She complains of stiffness throughout.  She is now able to stand about 45 min before needing to rest. It is not as hard to get out of bed as it use to be.    Patient Stated Goals reduce pain and return to normal activities   Currently in Pain? Yes   Pain  Score 7    Pain Location Back   Pain Orientation Right;Left;Lower   Pain Descriptors / Indicators Aching;Tightness   Aggravating Factors  some exercises.    Pain Relieving Factors laying down and resting             [facility] PT Assessment - 06/18/16 0001      Assessment   Medical Diagnosis left knee pain, Rt hip trochantaric bursitis, neck pain   Referring Provider Dr. Helane Rima   Onset Date/Surgical Date 05/27/16     AROM   AROM Assessment Site Knee   Right/Left Knee Left   Left Knee Flexion 115                     [facility] Adult PT Treatment/Exercise - 06/18/16 0001      Neck Exercises: Standing   Other Standing Exercises scap squeeze with noodle 5 sec x 12; VC for breathing during exercise.      Knee/Hip Exercises: Stretches   Passive Hamstring Stretch Right;Left;2 reps;20 seconds   Other Knee/Hip Stretches Lower trunk rotation x 1 min (within tolerance) x 2 reps      Knee/Hip Exercises: Aerobic   Nustep L4  x 6 min (arms and legs)     Knee/Hip Exercises: Seated   Sit to Sand 10 reps;without UE support     Knee/Hip Exercises: Supine   Heel Slides Left;5 reps;1 set   Bridges Limitations 2 sets of 5 reps   Straight Leg Raises Right;Left;1 set;10 reps     Modalities   Modalities Moist Heat     Moist Heat Therapy   Number Minutes Moist Heat 12 Minutes   Moist Heat Location Cervical;Lumbar Spine                  PT Short Term Goals - 06/09/16 1106      PT SHORT TERM GOAL #1   Title Pt will be independent in initial HEP   Time 4   Period Weeks   Status On-going     PT SHORT TERM GOAL #2   Title Pt will improve bilat LE strength to 4/5 to improve ability to stand without increased symptoms   Time 4   Period Weeks   Status On-going     PT SHORT TERM GOAL #3   Title Pt demo good form with cervical stretches to improve ROM   Time 4   Period Weeks   Status On-going           PT Long Term Goals - 06/18/16 1142      PT LONG  TERM GOAL #1   Title Pt will be independent with advanced HEP   Time 8   Period Weeks   Status On-going     PT LONG TERM GOAL #2   Title Pt will improve FOTO to <=50% to demo improved mobility   Time 8   Period Weeks   Status On-going     PT LONG TERM GOAL #3   Title Pt will improve bilat LE strength to 4+/5 to tolerate increased activity without pain   Time 8   Period Weeks   Status On-going     PT LONG TERM GOAL #4   Title Pt will improve cervical sidebending and rotation ROM by 25%   Time 8   Period Weeks   Status On-going     PT LONG TERM GOAL #5   Title Pt will tolerate standing x 10 minutes without increase in symptoms   Time 8   Period Weeks   Status Achieved               Plan - 06/18/16 1144    Clinical Impression Statement Pt demonstrated improved sit to/from stand from lower surface. Her Lt knee Flexion ROM has improved by 10 deg. Pt reported decreased back pain during exercise and further reduction after MHP at end of session.  She can now stand longer than 10 min without increased symptoms; has met LTG # 5.  Progressing towards goals.    Rehab Potential Good   PT Frequency 2x / week   PT Duration 8 weeks   PT Treatment/Interventions ADLs/Self Care Home Management;Electrical Stimulation;Iontophoresis 29m/ml Dexamethasone;Cryotherapy;Traction;Moist Heat;Therapeutic activities;Ultrasound;Patient/family education;Dry needling;Manual techniques;Functional mobility training;Therapeutic exercise;Balance training;Neuromuscular re-education;Vasopneumatic Device;Taping;Passive range of motion   PT Next Visit Plan Continue progressive core and LE strengthening.        Patient will benefit from skilled therapeutic intervention in order to improve the following deficits and impairments:  Decreased activity tolerance, Decreased strength, Hypomobility, Increased edema, Decreased endurance, Decreased range of motion, Decreased mobility, Pain, Impaired  flexibility  Visit Diagnosis: Acute pain of left knee  Cervicalgia  Muscle weakness (generalized)  Problem List Patient Active Problem List   Diagnosis Date Noted  . Neck pain 05/25/2016  . Bilateral leg pain 03/29/2014  . Mass of right hip region 03/13/2014  . Fatty liver disease, nonalcoholic 89/78/4784  . Abnormal LFTs 11/08/2013  . Hypokalemia 10/05/2013  . Rheumatoid arthritis ([place]) 05/23/2013  . Trochanteric bursitis of right hip 04/15/2012  . Palate deformity 03/01/2012  . IFG (impaired fasting glucose) 02/03/2012  . Hypercalcemia 02/03/2012  . Hearing loss 12/10/2011  . Hypothyroid 10/17/2010  . Allergic rhinitis 10/17/2010  . Essential hypertension, benign 10/17/2010  . Fibromyalgia 10/17/2010  . Post herpetic neuralgia 10/17/2010  . Asthma, chronic 10/17/2010   Kerin Perna, PTA 06/18/16 11:51 AM  [facility] [place], [place], [address] Phone: [phone]   Fax:  [phone]  Name: VEVA GRIMLEY MRN: [id number] Date of Birth: [birth year]

## 2016-06-25 ENCOUNTER — Encounter: Payer: Medicare Other | Admitting: Physical Therapy

## 2016-07-02 ENCOUNTER — Ambulatory Visit (INDEPENDENT_AMBULATORY_CARE_PROVIDER_SITE_OTHER): Payer: Medicare Other | Admitting: Physical Therapy

## 2016-07-02 DIAGNOSIS — M6281 Muscle weakness (generalized): Secondary | ICD-10-CM | POA: Diagnosis not present

## 2016-07-02 DIAGNOSIS — M25562 Pain in left knee: Secondary | ICD-10-CM

## 2016-07-02 DIAGNOSIS — M542 Cervicalgia: Secondary | ICD-10-CM

## 2016-07-02 NOTE — Patient Instructions (Signed)
  Low Row: Standing   Face anchor, feet shoulder width apart. Palms up, pull arms back, squeezing shoulder blades together. Repeat 10__ times per set. Do 1-2__ sets per session. Do 2-3__ sessions per week. Anchor Height: Waist   Strengthening: Resisted Extension   Hold tubing in  hands, arms forward. Pull arms back, elbow straight. Repeat _10___ times per set. Do 1-2____ sets per session. Do 2-3____ sessions per week.   [facility] [address] [place], [place] [address]  [phone] (office) [phone] (fax)

## 2016-07-02 NOTE — Therapy (Signed)
[place], [place], [address] Phone: [phone]   Fax:  [phone]  Physical Therapy Treatment  Patient Details  Name: Kiara Clayton MRN: [id number] Date of Birth: 1941-07-15 Referring Provider: Dr. Dianah Field  Encounter Date: 07/02/2016      PT End of Session - 07/02/16 1107    Visit Number 4   Number of Visits 16   Date for PT Re-Evaluation 07/29/16   PT Start Time 1100   PT Stop Time 1154   PT Time Calculation (min) 54 min   Activity Tolerance Patient tolerated treatment well;No increased pain   Behavior During Therapy WFL for tasks assessed/performed      Past Medical History:  Diagnosis Date  . Allergic rhinitis   . Asthma, chronic   . Diphtheria   . Essential hypertension, benign   . Fibromyalgia    See Dr. Charlestine Night (rhuem) in [place]. Uses Aleve and cyclobenzaprine and lyrica.  [facility]rine no longer covered as of 2015 by [facility].   Marland Kitchen Hearing loss   . Heart murmur   . Hypercalcemia   . Hypothyroid   . IFG (impaired fasting glucose)   . Palate deformity   . Post herpetic neuralgia    On Lyrica.  Sees Dr. Hurley Cisco (rheumatology).     . Rheumatoid arthritis ([place])   . Trochanteric bursitis of right hip     Past Surgical History:  Procedure Laterality Date  . BREAST BIOPSY  1988   Dr. Bubba Camp, no cancer.   Marland Kitchen [place]  . LUMBAR DISC SURGERY  1990   Dr. Verdene Lennert, [place]  . TONSILLECTOMY AND ADENOIDECTOMY     As a child   . TOTAL ABDOMINAL HYSTERECTOMY W/ BILATERAL SALPINGOOPHORECTOMY  1988    There were no vitals filed for this visit.      Subjective Assessment - 07/02/16 1107    Subjective Pt reports her legs are a little sore when doing exercises in bed in the morning.  She has good days and bad days, today is a pretty good day.     Pertinent History lumbar fusion   Patient Stated Goals reduce pain and return to normal  activities   Currently in Pain? No/denies   Pain Score 0-No pain            [facility] PT Assessment - 07/02/16 0001      Assessment   Medical Diagnosis left knee pain, Rt hip trochantaric bursitis, neck pain   Referring Provider Dr. Dianah Field   Onset Date/Surgical Date 05/27/16     AROM   AROM Assessment Site Cervical   Cervical Flexion 37   Cervical Extension 20   Cervical - Right Side Bend 25   Cervical - Left Side Bend 25   Cervical - Right Rotation 51   Cervical - Left Rotation 60           [facility] Adult PT Treatment/Exercise - 07/02/16 0001      Neck Exercises: Theraband   Shoulder Extension 10 reps  yellow, then red    Rows 10 reps  yellow then red.    Shoulder External Rotation 10 reps  with back against noodle      Neck Exercises: Standing   Other Standing Exercises scap squeeze with noodle 5 sec x 10; VC for breathing during exercise.      Neck Exercises: Supine   Other Supine Exercise trial of supine to sit (  simulate getting up from floor) ; prone attempt to get to quadruped - unable to push to position, returned to sitting EOB.      Knee/Hip Exercises: Stretches   Passive Hamstring Stretch 2 reps;Right;Left  45 sec, supine with strap   Other Knee/Hip Stretches Lower trunk rotation x 1 min (within tolerance) x 2 reps      Knee/Hip Exercises: Aerobic   Nustep L4 x 6.5 min (arms and legs)     Knee/Hip Exercises: Standing   Other Standing Knee Exercises Sit to/from stand x 10 reps (without UE support, eccentric)     Knee/Hip Exercises: Supine   Bridges Limitations --  25 reps    Straight Leg Raises Right;Left;1 set;10 reps     Modalities   Modalities Moist Heat     Moist Heat Therapy   Number Minutes Moist Heat 12 Minutes   Moist Heat Location Cervical;Lumbar Spine     Neck Exercises: Stretches   Other Neck Stretches lateral cervical flexion 10 sec x 5 each side                  PT Short Term Goals - 06/09/16 1106      PT SHORT  TERM GOAL #1   Title Pt will be independent in initial HEP   Time 4   Period Weeks   Status On-going     PT SHORT TERM GOAL #2   Title Pt will improve bilat LE strength to 4/5 to improve ability to stand without increased symptoms   Time 4   Period Weeks   Status On-going     PT SHORT TERM GOAL #3   Title Pt demo good form with cervical stretches to improve ROM   Time 4   Period Weeks   Status On-going           PT Long Term Goals - 06/18/16 1142      PT LONG TERM GOAL #1   Title Pt will be independent with advanced HEP   Time 8   Period Weeks   Status On-going     PT LONG TERM GOAL #2   Title Pt will improve FOTO to <=50% to demo improved mobility   Time 8   Period Weeks   Status On-going     PT LONG TERM GOAL #3   Title Pt will improve bilat LE strength to 4+/5 to tolerate increased activity without pain   Time 8   Period Weeks   Status On-going     PT LONG TERM GOAL #4   Title Pt will improve cervical sidebending and rotation ROM by 25%   Time 8   Period Weeks   Status On-going     PT LONG TERM GOAL #5   Title Pt will tolerate standing x 10 minutes without increase in symptoms   Time 8   Period Weeks   Status Achieved               Plan - 07/02/16 1211    Clinical Impression Statement Pt's exercise tolerance has improved; she is able to tolerate increased time and reps of exercise.   Her cervical spine ROM remains limited.  Knee and hip are less painful now that she is doing exercises each day.  She is progressing towards established goals.    Rehab Potential Good   PT Frequency 2x / week   PT Duration 8 weeks   PT Treatment/Interventions ADLs/Self Care Home Management;Electrical Stimulation;Iontophoresis 4mg /ml Dexamethasone;Cryotherapy;Traction;Moist Heat;Therapeutic activities;Ultrasound;Patient/family  education;Dry needling;Manual techniques;Functional mobility training;Therapeutic exercise;Balance training;Neuromuscular  re-education;Vasopneumatic Device;Taping;Passive range of motion   PT Next Visit Plan Test LE strength.  Review HEP and progress as tolerated.  Pt will be away during holidays.     Consulted and Agree with Plan of Care Patient      Patient will benefit from skilled therapeutic intervention in order to improve the following deficits and impairments:  Decreased activity tolerance, Decreased strength, Hypomobility, Increased edema, Decreased endurance, Decreased range of motion, Decreased mobility, Pain, Impaired flexibility  Visit Diagnosis: Acute pain of left knee  Cervicalgia  Muscle weakness (generalized)     Problem List Patient Active Problem List   Diagnosis Date Noted  . Neck pain 05/25/2016  . Bilateral leg pain 03/29/2014  . Mass of right hip region 03/13/2014  . Fatty liver disease, nonalcoholic 99991111  . Abnormal LFTs 11/08/2013  . Hypokalemia 10/05/2013  . Rheumatoid arthritis ([place]) 05/23/2013  . Trochanteric bursitis of right hip 04/15/2012  . Palate deformity 03/01/2012  . IFG (impaired fasting glucose) 02/03/2012  . Hypercalcemia 02/03/2012  . Hearing loss 12/10/2011  . Hypothyroid 10/17/2010  . Allergic rhinitis 10/17/2010  . Essential hypertension, benign 10/17/2010  . Fibromyalgia 10/17/2010  . Post herpetic neuralgia 10/17/2010  . Asthma, chronic 10/17/2010   Kerin Perna, PTA 07/02/16 12:23 PM  [place], [place], [address] Phone: [phone]   Fax:  [phone]  Name: AMARY CROGNALE MRN: [id number] Date of Birth: 1940/11/14

## 2016-07-07 DIAGNOSIS — Z79899 Other long term (current) drug therapy: Secondary | ICD-10-CM | POA: Diagnosis not present

## 2016-07-07 DIAGNOSIS — M0609 Rheumatoid arthritis without rheumatoid factor, multiple sites: Secondary | ICD-10-CM | POA: Diagnosis not present

## 2016-07-09 ENCOUNTER — Ambulatory Visit (INDEPENDENT_AMBULATORY_CARE_PROVIDER_SITE_OTHER): Payer: Medicare Other | Admitting: Rehabilitative and Restorative Service Providers"

## 2016-07-09 DIAGNOSIS — M25562 Pain in left knee: Secondary | ICD-10-CM | POA: Diagnosis not present

## 2016-07-09 DIAGNOSIS — M542 Cervicalgia: Secondary | ICD-10-CM | POA: Diagnosis not present

## 2016-07-09 DIAGNOSIS — M6281 Muscle weakness (generalized): Secondary | ICD-10-CM | POA: Diagnosis not present

## 2016-07-09 NOTE — Therapy (Signed)
[place], [place], [address] Phone: [phone]   Fax:  [phone]  Physical Therapy Treatment  Patient Details  Name: Kiara Clayton MRN: [id number] Date of Birth: [birth year] Referring Provider: Dr Dianah Field  Encounter Date: 07/09/2016      PT End of Session - 07/09/16 1106    Visit Number 5   Number of Visits 16   Date for PT Re-Evaluation 07/29/16   PT Start Time 1105   PT Stop Time 1200   PT Time Calculation (min) 55 min      Past Medical History:  Diagnosis Date  . Allergic rhinitis   . Asthma, chronic   . Diphtheria   . Essential hypertension, benign   . Fibromyalgia    See Dr. Charlestine Night (rhuem) in [place]. Uses Aleve and cyclobenzaprine and lyrica.  Cyclobenzaprine no longer covered as of 2015 by [facility].   Marland Kitchen Hearing loss   . Heart murmur   . Hypercalcemia   . Hypothyroid   . IFG (impaired fasting glucose)   . Palate deformity   . Post herpetic neuralgia    On Lyrica.  Sees Dr. Hurley Cisco (rheumatology).     . Rheumatoid arthritis ([place])   . Trochanteric bursitis of right hip     Past Surgical History:  Procedure Laterality Date  . BREAST BIOPSY  1988   Dr. Bubba Camp, no cancer.   Marland Kitchen [place]  . LUMBAR DISC SURGERY  1990   Dr. Verdene Lennert, [place]  . TONSILLECTOMY AND ADENOIDECTOMY     As a child   . TOTAL ABDOMINAL HYSTERECTOMY W/ BILATERAL SALPINGOOPHORECTOMY  1988    There were no vitals filed for this visit.      Subjective Assessment - 07/09/16 1109    Currently in Pain? Yes   Pain Score 7   sometimes when she gets up but not every time    Pain Location Hip   Pain Orientation Right   Pain Descriptors / Indicators Sharp   Aggravating Factors  getting out of bed - but not always    Pain Relieving Factors moving the right way to get out of bed             [facility] PT Assessment - 07/09/16 0001      Assessment   Medical  Diagnosis left knee pain, Rt hip trochantaric bursitis, neck pain   Referring Provider Dr Dianah Field   Onset Date/Surgical Date 05/27/16     Observation/Other Assessments   Focus on Therapeutic Outcomes (FOTO)  55% limitation      AROM   Cervical Flexion 37   Cervical Extension 20   Cervical - Right Side Bend 25   Cervical - Left Side Bend 25   Cervical - Right Rotation 51   Cervical - Left Rotation 60     Strength   Overall Strength Comments bilat hip and knee grossly 4/5 to 4+/5                      [facility] Adult PT Treatment/Exercise - 07/09/16 0001      Neck Exercises: Theraband   Shoulder Extension 10 reps  yellow, then red    Rows 10 reps  yellow then red.    Shoulder External Rotation 10 reps  with back against noodle      Neck Exercises: Standing   Other Standing Exercises scap squeeze with noodle 5 sec  x 10; VC for breathing during exercise.      Neck Exercises: Supine   Other Supine Exercise supine to sit to stand - working on good alignment and movement patterns with transfers/transitional movements      Knee/Hip Exercises: Stretches   Passive Hamstring Stretch 2 reps;Right;Left  45 sec, supine with strap   Other Knee/Hip Stretches Lower trunk rotation x 1 min (within tolerance) x 2 reps      Knee/Hip Exercises: Aerobic   Nustep L5 x 7 min (arms and legs)     Knee/Hip Exercises: Standing   Other Standing Knee Exercises Sit to/from stand x 10 reps (without UE support, eccentric)     Knee/Hip Exercises: Supine   Bridges Limitations --  25 reps    Straight Leg Raises Right;Left;1 set;10 reps     Modalities   Modalities Moist Heat     Moist Heat Therapy   Number Minutes Moist Heat 15 Minutes   Moist Heat Location Cervical;Lumbar Spine     Neck Exercises: Stretches   Other Neck Stretches lateral cervical flexion 10 sec x 5 each side                  PT Short Term Goals - 07/09/16 1159      PT SHORT TERM GOAL #1   Title Pt  will be independent in initial HEP   Time 4   Period Weeks   Status Achieved     PT SHORT TERM GOAL #2   Title Pt will improve bilat LE strength to 4/5 to improve ability to stand without increased symptoms   Time 4   Status Achieved     PT SHORT TERM GOAL #3   Title Pt demo good form with cervical stretches to improve ROM   Time 4   Period Weeks   Status Partially Met           PT Long Term Goals - 07/09/16 1159      PT LONG TERM GOAL #1   Title Pt will be independent with advanced HEP   Time 8   Period Weeks   Status Partially Met     PT LONG TERM GOAL #2   Title Pt will improve FOTO to <=50% to demo improved mobility   Time 8   Period Weeks   Status Partially Met     PT LONG TERM GOAL #3   Title Pt will improve bilat LE strength to 4+/5 to tolerate increased activity without pain   Time 8   Period Weeks   Status Partially Met     PT LONG TERM GOAL #4   Title Pt will improve cervical sidebending and rotation ROM by 25%   Time 8   Period Weeks   Status Partially Met     PT LONG TERM GOAL #5   Title Pt will tolerate standing x 10 minutes without increase in symptoms   Time 8   Period Weeks   Status Achieved             Patient will benefit from skilled therapeutic intervention in order to improve the following deficits and impairments:     Visit Diagnosis: Acute pain of left knee  Cervicalgia  Muscle weakness (generalized)     Problem List Patient Active Problem List   Diagnosis Date Noted  . Neck pain 05/25/2016  . Bilateral leg pain 03/29/2014  . Mass of right hip region 03/13/2014  . Fatty liver disease, nonalcoholic 79/48/0165  .  Abnormal LFTs 11/08/2013  . Hypokalemia 10/05/2013  . Rheumatoid arthritis ([place]) 05/23/2013  . Trochanteric bursitis of right hip 04/15/2012  . Palate deformity 03/01/2012  . IFG (impaired fasting glucose) 02/03/2012  . Hypercalcemia 02/03/2012  . Hearing loss 12/10/2011  . Hypothyroid 10/17/2010   . Allergic rhinitis 10/17/2010  . Essential hypertension, benign 10/17/2010  . Fibromyalgia 10/17/2010  . Post herpetic neuralgia 10/17/2010  . Asthma, chronic 10/17/2010    Tereka Thorley Nilda Simmer PT, MPH  07/09/2016, 12:01 PM  [facility] [place], [place], [address] Phone: [phone]   Fax:  [phone]  Name: Kiara Clayton MRN: [id number] Date of Birth: 04/04/1941   PHYSICAL THERAPY DISCHARGE SUMMARY  Visits from Start of Care: 12  Current functional level related to goals / functional outcomes: See PT progress note for discharge status   Remaining deficits: See note    Education / Equipment: HEP Plan: Patient agrees to discharge.  Patient goals were partially met. Patient is being discharged due to being pleased with the current functional level.  ?????    Delorus Langwell P. Helene Kelp PT, MPH 07/09/16 12:02 PM

## 2016-07-30 ENCOUNTER — Ambulatory Visit (INDEPENDENT_AMBULATORY_CARE_PROVIDER_SITE_OTHER): Payer: Medicare Other | Admitting: Family Medicine

## 2016-07-30 ENCOUNTER — Encounter: Payer: Self-pay | Admitting: Family Medicine

## 2016-07-30 VITALS — BP 114/64 | HR 95 | Ht 65.0 in | Wt 193.0 lb

## 2016-07-30 DIAGNOSIS — R200 Anesthesia of skin: Secondary | ICD-10-CM

## 2016-07-30 DIAGNOSIS — E038 Other specified hypothyroidism: Secondary | ICD-10-CM

## 2016-07-30 DIAGNOSIS — I10 Essential (primary) hypertension: Secondary | ICD-10-CM | POA: Diagnosis not present

## 2016-07-30 DIAGNOSIS — R0683 Snoring: Secondary | ICD-10-CM

## 2016-07-30 MED ORDER — LEVOTHYROXINE SODIUM 125 MCG PO TABS: 125.0000 ug | ORAL_TABLET | Freq: Every day | ORAL | 1 refills | Status: DC

## 2016-07-30 NOTE — Progress Notes (Signed)
   Subjective:    Patient ID: Kiara Clayton, female    DOB: [birth year], 76 y.o.   MRN: [id number]  HPI 76 year old female is here today to follow-up on her MRI results. She had a near syncopal episode back in November with some persistent right-sided numbness.  We decided to do an MRI which she had done in November. There was no sign of stroke to some chronic small vessel changes which happens as we age. Encouraged her to work on healthy eating a regular exercise. He says the numbness really hasn't bothered her much that she says it has come and gone a few times but she really thinks a lot of this is stress related. She did lose her sister recently.  Snoring-she still has not heard back about scheduling her sleep study. That she does not have an active telephone number.    Review of Systems     Objective:   Physical Exam  Constitutional: She is oriented to person, place, and time. She appears well-developed and well-nourished.  HENT:  Head: Normocephalic and atraumatic.  Eyes: Conjunctivae and EOM are normal.  Cardiovascular: Normal rate.   Pulmonary/Chest: Effort normal.  Neurological: She is alert and oriented to person, place, and time.  Skin: Skin is dry. No pallor.  Psychiatric: She has a normal mood and affect. Her behavior is normal.  Vitals reviewed.      Assessment & Plan:  Here today to go over MRI results were. Reviewed those with her. Just some chronic small vessel changes. She will probably benefit from being on a statin and aspirin. She is intolerant to aspirin really doesn't want to take another medication. Otherwise the imaging was reassuring as far as no mass or stroke etc. Next  Hypertension- Well controlled. Continue current regimen. Follow up in 6 months.   Snoring - stop bang questionnaire score of 3 which does put her into the high risk category. Recommend referral for sleep apnea.

## 2016-08-24 ENCOUNTER — Other Ambulatory Visit: Payer: Self-pay | Admitting: Family Medicine

## 2016-08-24 DIAGNOSIS — Z1231 Encounter for screening mammogram for malignant neoplasm of breast: Secondary | ICD-10-CM

## 2016-09-14 ENCOUNTER — Ambulatory Visit (HOSPITAL_BASED_OUTPATIENT_CLINIC_OR_DEPARTMENT_OTHER): Payer: Medicare Other | Attending: Family Medicine

## 2016-09-18 ENCOUNTER — Ambulatory Visit (INDEPENDENT_AMBULATORY_CARE_PROVIDER_SITE_OTHER): Payer: Medicare Other

## 2016-09-18 DIAGNOSIS — Z1231 Encounter for screening mammogram for malignant neoplasm of breast: Secondary | ICD-10-CM

## 2016-09-22 ENCOUNTER — Encounter: Payer: Self-pay | Admitting: *Deleted

## 2016-10-09 ENCOUNTER — Ambulatory Visit (INDEPENDENT_AMBULATORY_CARE_PROVIDER_SITE_OTHER): Payer: Medicare Other | Admitting: Family Medicine

## 2016-10-09 ENCOUNTER — Encounter: Payer: Self-pay | Admitting: Family Medicine

## 2016-10-09 VITALS — BP 142/54 | HR 89 | Ht 65.0 in | Wt 193.0 lb

## 2016-10-09 DIAGNOSIS — E038 Other specified hypothyroidism: Secondary | ICD-10-CM

## 2016-10-09 DIAGNOSIS — L2481 Irritant contact dermatitis due to metals: Secondary | ICD-10-CM

## 2016-10-09 LAB — TSH: TSH: 0.67 m[IU]/L

## 2016-10-09 NOTE — Progress Notes (Signed)
   Subjective:    Patient ID: Kiara Clayton, female    DOB: 25-Jun-1941, 76 y.o.   MRN: [id number]  HPI Hypothyroidism-overall doing well. She says she's not sure if her thyroid is okay but she hasn't had any significant changes in weight or energy level. No recent skin or hair changes.  She does have a life alert necklace that she's been wearing and the chain on it has actually been breaking her skin out around her neck.  She also brought in a bill from Last August from [place] that wasn't paid.   Review of Systems     Objective:   Physical Exam  Constitutional: She is oriented to person, place, and time. She appears well-developed and well-nourished.  HENT:  Head: Normocephalic and atraumatic.  Neck:  Erythematous maculopapular rash around her neck.   Cardiovascular: Normal rate, regular rhythm and normal heart sounds.   Pulmonary/Chest: Effort normal and breath sounds normal.  Neurological: She is alert and oriented to person, place, and time.  Skin: Skin is warm and dry.  Psychiatric: She has a normal mood and affect. Her behavior is normal.       Assessment & Plan:  Hypothyroidism - Due to recheck TSH.  F/U in 6 months.  Asymptomatic.   Contact dermatitis - recommend change to a rope or leather chain.

## 2016-10-13 ENCOUNTER — Telehealth: Payer: Self-pay | Admitting: Family Medicine

## 2016-10-13 NOTE — Telephone Encounter (Signed)
I printed off the message and placed up front for pt to p/u when she comes in to get her results.Kiara Clayton

## 2016-10-13 NOTE — Telephone Encounter (Signed)
Kiara Clayton, CMA gave me a Veterinary surgeon from [place] and ask if I would look into it.  I called [facility] at [phone] and spoke with [facility].  This is the patient's fourth notice and has been turned over to collections for $79.55.  Asencion Partridge was able to look into the account to determine that the Insurance came back denied from date of service 09/05/15 stating that the patient did not have Medicare.  [facility] kept sending out notices and [facility] never called to ask about the notice therefore the account is now in collections.  I gave the Insurance information to [place], they will try and file with [facility] but it may come back due to untimely filing.  Asencion Partridge also sent a note to the collection agency letting them know that they are refiling the claim.  If [employer] Medicare pays then the bill will come out out collections.  If not, [facility] will be responsible for the entire bill, $79.55.  I called the patient at 1:25pm to explain what is going on with the bill. I was unable to leave a message due to the mailbox not being set up.

## 2016-10-29 ENCOUNTER — Other Ambulatory Visit: Payer: Self-pay | Admitting: Family Medicine

## 2017-01-06 DIAGNOSIS — Z79899 Other long term (current) drug therapy: Secondary | ICD-10-CM | POA: Diagnosis not present

## 2017-01-06 DIAGNOSIS — M0609 Rheumatoid arthritis without rheumatoid factor, multiple sites: Secondary | ICD-10-CM | POA: Diagnosis not present

## 2017-02-11 ENCOUNTER — Ambulatory Visit (INDEPENDENT_AMBULATORY_CARE_PROVIDER_SITE_OTHER): Payer: Medicare Other

## 2017-02-11 ENCOUNTER — Encounter: Payer: Self-pay | Admitting: Sports Medicine

## 2017-02-11 ENCOUNTER — Ambulatory Visit (INDEPENDENT_AMBULATORY_CARE_PROVIDER_SITE_OTHER): Payer: Medicare Other | Admitting: Sports Medicine

## 2017-02-11 DIAGNOSIS — M1712 Unilateral primary osteoarthritis, left knee: Secondary | ICD-10-CM | POA: Insufficient documentation

## 2017-02-11 DIAGNOSIS — M11262 Other chondrocalcinosis, left knee: Secondary | ICD-10-CM | POA: Diagnosis not present

## 2017-02-11 DIAGNOSIS — M11261 Other chondrocalcinosis, right knee: Secondary | ICD-10-CM

## 2017-02-11 DIAGNOSIS — M79644 Pain in right finger(s): Secondary | ICD-10-CM | POA: Diagnosis not present

## 2017-02-11 DIAGNOSIS — S8991XA Unspecified injury of right lower leg, initial encounter: Secondary | ICD-10-CM | POA: Diagnosis not present

## 2017-02-11 DIAGNOSIS — M25562 Pain in left knee: Secondary | ICD-10-CM | POA: Diagnosis not present

## 2017-02-11 DIAGNOSIS — M179 Osteoarthritis of knee, unspecified: Secondary | ICD-10-CM | POA: Diagnosis not present

## 2017-02-11 DIAGNOSIS — M25462 Effusion, left knee: Secondary | ICD-10-CM

## 2017-02-11 DIAGNOSIS — M7989 Other specified soft tissue disorders: Secondary | ICD-10-CM | POA: Diagnosis not present

## 2017-02-11 DIAGNOSIS — S6991XA Unspecified injury of right wrist, hand and finger(s), initial encounter: Secondary | ICD-10-CM | POA: Diagnosis not present
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
## 2017-02-11 NOTE — Assessment & Plan Note (Signed)
Aspiration and injection, x-rays, return in one month.

## 2017-02-12 ENCOUNTER — Encounter: Payer: Self-pay | Admitting: Sports Medicine

## 2017-02-17 ENCOUNTER — Encounter: Payer: Self-pay | Admitting: Physician Assistant

## 2017-02-17 ENCOUNTER — Telehealth: Payer: Self-pay | Admitting: Family Medicine

## 2017-02-17 ENCOUNTER — Ambulatory Visit (INDEPENDENT_AMBULATORY_CARE_PROVIDER_SITE_OTHER): Payer: Medicare Other | Admitting: Sports Medicine

## 2017-02-17 DIAGNOSIS — M1712 Unilateral primary osteoarthritis, left knee: Secondary | ICD-10-CM

## 2017-02-17 MED ORDER — CELECOXIB 200 MG PO CAPS: ORAL_CAPSULE | ORAL | 2 refills | Status: DC

## 2017-02-17 NOTE — Assessment & Plan Note (Addendum)
Patient is back early, having some pain after injection. Adding formal physical therapy, Celebrex. Viscosupplementation if no better.

## 2017-02-17 NOTE — Progress Notes (Signed)
  Subjective:    CC: Follow-up  HPI: I injected Kiara Clayton his left knee just a few days ago, she did well but had a recurrence of pain, she was placed on Nelson Chimes, PA-C's schedule inadvertently. I have agreed to see her, pain is moderate, persistent, localized at the posterior and medial joint lines without radiation, no mechanical symptoms. She's not taking any NSAIDs. Her finger pain is also moderate to severe, x-rays showed severe osteoarthritis to the point of ankylosis.   Past medical history:  Negative.  See flowsheet/record as well for more information.  Surgical history: Negative.  See flowsheet/record as well for more information.  Family history: Negative.  See flowsheet/record as well for more information.  Social history: Negative.  See flowsheet/record as well for more information.  Allergies, and medications have been entered into the medical record, reviewed, and no changes needed.   Review of Systems: No fevers, chills, night sweats, weight loss, chest pain, or shortness of breath.   Objective:    General: Well Developed, well nourished, and in no acute distress.  Neuro: Alert and oriented x3, extra-ocular muscles intact, sensation grossly intact.  HEENT: Normocephalic, atraumatic, pupils equal round reactive to light, neck supple, no masses, no lymphadenopathy, thyroid nonpalpable.  Skin: Warm and dry, no rashes. Cardiac: Regular rate and rhythm, no murmurs rubs or gallops, no lower extremity edema.  Respiratory: Clear to auscultation bilaterally. Not using accessory muscles, speaking in full sentences. Left Knee: Minimal swelling, tender, no warmth or signs of infection. ROM normal in flexion and extension and lower leg rotation. Ligaments with solid consistent endpoints including ACL, PCL, LCL, MCL. Negative Mcmurray's and provocative meniscal tests. Non painful patellar compression. Patellar and quadriceps tendons unremarkable. Hamstring and quadriceps strength  is normal.  Impression and Recommendations:    Primary osteoarthritis of left knee Patient is back early, having some pain after injection. Adding formal physical therapy, Celebrex. Viscosupplementation if no better.  I spent 25 minutes with this patient, greater than 50% was face-to-face time counseling regarding the above diagnoses

## 2017-02-26 DIAGNOSIS — M069 Rheumatoid arthritis, unspecified: Secondary | ICD-10-CM | POA: Diagnosis not present

## 2017-02-26 DIAGNOSIS — H26491 Other secondary cataract, right eye: Secondary | ICD-10-CM | POA: Diagnosis not present

## 2017-02-26 DIAGNOSIS — Z79899 Other long term (current) drug therapy: Secondary | ICD-10-CM | POA: Diagnosis not present

## 2017-02-26 DIAGNOSIS — H40003 Preglaucoma, unspecified, bilateral: Secondary | ICD-10-CM | POA: Diagnosis not present

## 2017-03-15 ENCOUNTER — Telehealth: Payer: Self-pay | Admitting: Family Medicine

## 2017-03-15 NOTE — Telephone Encounter (Signed)
Pt came in the office and was wanting to know if dr. Madilyn Fireman was wanting any blood work from her before her appt on Sept.@1st . If so she would like to go ahead and get it done before her appt. Thanks

## 2017-03-16 NOTE — Telephone Encounter (Signed)
Unable to reach patient, voicemail has not be set up.

## 2017-03-17 ENCOUNTER — Encounter: Payer: Self-pay | Admitting: Sports Medicine

## 2017-03-17 ENCOUNTER — Other Ambulatory Visit: Payer: Self-pay

## 2017-03-17 ENCOUNTER — Ambulatory Visit (INDEPENDENT_AMBULATORY_CARE_PROVIDER_SITE_OTHER): Payer: Medicare Other | Admitting: Sports Medicine

## 2017-03-17 DIAGNOSIS — R7301 Impaired fasting glucose: Secondary | ICD-10-CM

## 2017-03-17 DIAGNOSIS — I10 Essential (primary) hypertension: Secondary | ICD-10-CM

## 2017-03-17 DIAGNOSIS — M059 Rheumatoid arthritis with rheumatoid factor, unspecified: Secondary | ICD-10-CM

## 2017-03-17 DIAGNOSIS — E038 Other specified hypothyroidism: Secondary | ICD-10-CM

## 2017-03-17 NOTE — Telephone Encounter (Signed)
Labs ordered and Luellen Pucker gave to patient.
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
## 2017-04-05 DIAGNOSIS — I10 Essential (primary) hypertension: Secondary | ICD-10-CM | POA: Diagnosis not present

## 2017-04-05 DIAGNOSIS — E038 Other specified hypothyroidism: Secondary | ICD-10-CM | POA: Diagnosis not present

## 2017-04-05 DIAGNOSIS — R7301 Impaired fasting glucose: Secondary | ICD-10-CM | POA: Diagnosis not present

## 2017-04-05 LAB — LIPID PANEL W/REFLEX DIRECT LDL
CHOL/HDL RATIO: 3 (calc) (ref <5.0)
Cholesterol: 225 mg/dL — ABNORMAL HIGH (ref <200)
HDL: 75 mg/dL (ref >50)
LDL Cholesterol (Calc): 130 mg/dL (calc) — ABNORMAL HIGH
Non-HDL Cholesterol (Calc): 150 mg/dL (calc) — ABNORMAL HIGH (ref <130)
TRIGLYCERIDES: 95 mg/dL (ref <150)

## 2017-04-05 LAB — CBC WITH DIFFERENTIAL/PLATELET
BASOS ABS: 31 {cells}/uL (ref 0–200)
BASOS PCT: 0.6 %
EOS ABS: 120 {cells}/uL (ref 15–500)
Eosinophils Relative: 2.3 %
HCT: 41.8 % (ref 35.0–45.0)
Hemoglobin: 14.4 g/dL (ref 11.7–15.5)
Lymphs Abs: 2080 cells/uL (ref 850–3900)
MCH: 30.3 pg (ref 27.0–33.0)
MCHC: 34.4 g/dL (ref 32.0–36.0)
MCV: 87.8 fL (ref 80.0–100.0)
MPV: 9.9 fL (ref 7.5–12.5)
Monocytes Relative: 9.1 %
NEUTROS PCT: 48 %
Neutro Abs: 2496 cells/uL (ref 1500–7800)
PLATELETS: 239 10*3/uL (ref 140–400)
RBC: 4.76 10*6/uL (ref 3.80–5.10)
RDW: 13 % (ref 11.0–15.0)
TOTAL LYMPHOCYTE: 40 %
WBC: 5.2 10*3/uL (ref 3.8–10.8)
WBCMIX: 473 {cells}/uL (ref 200–950)

## 2017-04-05 LAB — COMPLETE METABOLIC PANEL WITH GFR
AG Ratio: 1.8 (calc) (ref 1.0–2.5)
ALBUMIN MSPROF: 4.6 g/dL (ref 3.6–5.1)
ALT: 23 U/L (ref 6–29)
AST: 21 U/L (ref 10–35)
Alkaline phosphatase (APISO): 69 U/L (ref 33–130)
BUN: 13 mg/dL (ref 7–25)
CHLORIDE: 106 mmol/L (ref 98–110)
CO2: 27 mmol/L (ref 20–32)
Calcium: 10.1 mg/dL (ref 8.6–10.4)
Creat: 0.65 mg/dL (ref 0.60–0.93)
GFR, EST AFRICAN AMERICAN: 101 mL/min/{1.73_m2} (ref >=60)
GFR, Est Non African American: 87 mL/min/{1.73_m2} (ref >=60)
GLOBULIN: 2.5 g/dL (ref 1.9–3.7)
Glucose, Bld: 123 mg/dL — ABNORMAL HIGH (ref 65–99)
Potassium: 3.9 mmol/L (ref 3.5–5.3)
SODIUM: 141 mmol/L (ref 135–146)
TOTAL PROTEIN: 7.1 g/dL (ref 6.1–8.1)
Total Bilirubin: 0.5 mg/dL (ref 0.2–1.2)

## 2017-04-05 LAB — HEMOGLOBIN A1C
Hgb A1c MFr Bld: 5.6 % of total Hgb (ref <5.7)
Mean Plasma Glucose: 114 (calc)
eAG (mmol/L): 6.3 (calc)

## 2017-04-05 LAB — TSH: TSH: 0.05 mIU/L — ABNORMAL LOW (ref 0.40–4.50)

## 2017-04-07 ENCOUNTER — Encounter: Payer: Self-pay | Admitting: *Deleted

## 2017-04-09 ENCOUNTER — Ambulatory Visit (INDEPENDENT_AMBULATORY_CARE_PROVIDER_SITE_OTHER): Payer: Medicare Other | Admitting: Family Medicine

## 2017-04-09 VITALS — BP 136/67 | HR 90 | Ht 65.0 in | Wt 190.0 lb

## 2017-04-09 DIAGNOSIS — R7301 Impaired fasting glucose: Secondary | ICD-10-CM

## 2017-04-09 DIAGNOSIS — I10 Essential (primary) hypertension: Secondary | ICD-10-CM

## 2017-04-09 DIAGNOSIS — L608 Other nail disorders: Secondary | ICD-10-CM

## 2017-04-09 DIAGNOSIS — Z23 Encounter for immunization: Secondary | ICD-10-CM

## 2017-04-09 DIAGNOSIS — E038 Other specified hypothyroidism: Secondary | ICD-10-CM | POA: Diagnosis not present

## 2017-04-09 MED ORDER — CYCLOBENZAPRINE HCL 10 MG PO TABS: ORAL_TABLET | ORAL | 0 refills | Status: AC

## 2017-04-09 NOTE — Patient Instructions (Addendum)
Take half a tab of thyroid pill on Saturdays and whole tab all other days.   Vitamin for hair and nails.  Then if not better then we can check for deficiencies.

## 2017-04-09 NOTE — Progress Notes (Addendum)
Subjective:    CC: Thyroid and glucose  HPI:  Hypothyroid - No recent skin or hair changes. No significant weight changes. She reports that she is taking her medication regularly without any problems. Currently on 125 g daily.  Impaired fasting glucose-no increased thirst or urination. No symptoms consistent with hypoglycemia.  She has also noticed sharp vertical nail ridges.  She's noticed these since maybe January of this past year so for approximately 9 months. No known trauma or injury as it affects all of her fingernails. She's worried as that she has heard from some people that can actually be a sign of cancer.  She also describes a seasickness type sensation. She says that she will notice that when something is passing her to almost feel like she is a little lightheaded or dizzy almost like she's seasick. It's been going on for at least a few weeks but she really wasn't able to quantify specific time. No other known triggers. She has had a sore throat that's been coming and going.  Past medical history, Surgical history, Family history not pertinant except as noted below, Social history, Allergies, and medications have been entered into the medical record, reviewed, and corrections made.   Review of Systems: No fevers, chills, night sweats, weight loss, chest pain, or shortness of breath.   Objective:    General: Well Developed, well nourished, and in no acute distress.  Neuro: Alert and oriented x3, extra-ocular muscles intact, sensation grossly intact.  HEENT: Normocephalic, atraumatic.  Oropharynx looks okay she does have a plate in her upper palate. No significant cervical lymphadenopathy. No thyromegaly.  Skin: Warm and dry, no rashes. Cardiac: Regular rate and rhythm, no murmurs rubs or gallops, no lower extremity edema.  Respiratory: Clear to auscultation bilaterally. Not using accessory muscles, speaking in full sentences.   Impression and Recommendations:    Hypothyroid  - She went for her TSH soon as reviewed those results today. She does not have a telephone for which we can communicate her so she typically gets for her labs and then comes in to go over it. Her TSH is a little too suppressed on to have her drop down to half a tab on Saturdays and a whole tab the other 6 days a week and repeat weekly. Recommend recheck thyroid again in about 8 weeks.  IFG - A1c overall looks good. We'll continue to monitor Lab Results  Component Value Date   HGBA1C 5.6 04/05/2017    Nail deformity - Certainly could be because her thyroid is off as well. I want to try to get that better regulated and also encouraged her to take a vitamin for hair and nails and let's see if they start to improve over the next 3-6 months. If not then consider evaluating for possible deficiencies.  Dizziness lightheadedness-unclear etiology and it only happens when there is a movement out of the corner of her eye. Unclear what this could be. May be worth getting her eyes checked if she has not had a recent eye exam. If persists or gets worse than please let me know.

## 2017-04-20 DIAGNOSIS — M19041 Primary osteoarthritis, right hand: Secondary | ICD-10-CM | POA: Diagnosis not present

## 2017-04-20 DIAGNOSIS — Z79899 Other long term (current) drug therapy: Secondary | ICD-10-CM | POA: Diagnosis not present

## 2017-04-20 DIAGNOSIS — M150 Primary generalized (osteo)arthritis: Secondary | ICD-10-CM | POA: Diagnosis not present

## 2017-04-20 DIAGNOSIS — M19042 Primary osteoarthritis, left hand: Secondary | ICD-10-CM | POA: Diagnosis not present

## 2017-04-20 DIAGNOSIS — M79642 Pain in left hand: Secondary | ICD-10-CM | POA: Diagnosis not present

## 2017-04-20 DIAGNOSIS — M0579 Rheumatoid arthritis with rheumatoid factor of multiple sites without organ or systems involvement: Secondary | ICD-10-CM | POA: Diagnosis not present

## 2017-04-20 DIAGNOSIS — M79641 Pain in right hand: Secondary | ICD-10-CM | POA: Diagnosis not present

## 2017-05-04 DIAGNOSIS — E038 Other specified hypothyroidism: Secondary | ICD-10-CM | POA: Diagnosis not present

## 2017-05-04 LAB — TSH: TSH: 0.12 m[IU]/L — AB (ref 0.40–4.50)

## 2017-05-05 ENCOUNTER — Encounter: Payer: Self-pay | Admitting: *Deleted

## 2017-05-05 ENCOUNTER — Encounter: Payer: Self-pay | Admitting: Family Medicine

## 2017-05-05 NOTE — Addendum Note (Signed)
Addended by: Teddy Spike on: 05/05/2017 12:10 PM   Modules accepted: Orders

## 2017-05-10 ENCOUNTER — Ambulatory Visit: Payer: Medicare Other | Admitting: Family Medicine

## 2017-06-04 DIAGNOSIS — E038 Other specified hypothyroidism: Secondary | ICD-10-CM | POA: Diagnosis not present

## 2017-06-04 LAB — TSH: TSH: 0.23 m[IU]/L — AB (ref 0.40–4.50)

## 2017-06-07 ENCOUNTER — Encounter: Payer: Self-pay | Admitting: *Deleted

## 2017-06-07 ENCOUNTER — Other Ambulatory Visit: Payer: Self-pay | Admitting: *Deleted

## 2017-06-07 ENCOUNTER — Other Ambulatory Visit: Payer: Self-pay | Admitting: Family Medicine

## 2017-06-07 DIAGNOSIS — E038 Other specified hypothyroidism: Secondary | ICD-10-CM

## 2017-06-07 MED ORDER — LEVOTHYROXINE SODIUM 88 MCG PO TABS: 88.0000 ug | ORAL_TABLET | Freq: Every day | ORAL | 1 refills | Status: DC

## 2017-06-07 NOTE — Progress Notes (Signed)
Adjusting thyroid medicine

## 2017-06-21 DIAGNOSIS — M150 Primary generalized (osteo)arthritis: Secondary | ICD-10-CM | POA: Diagnosis not present

## 2017-06-21 DIAGNOSIS — Z79899 Other long term (current) drug therapy: Secondary | ICD-10-CM | POA: Diagnosis not present

## 2017-06-21 DIAGNOSIS — M0579 Rheumatoid arthritis with rheumatoid factor of multiple sites without organ or systems involvement: Secondary | ICD-10-CM | POA: Diagnosis not present

## 2017-07-08 ENCOUNTER — Ambulatory Visit: Payer: Medicare Other | Admitting: Family Medicine

## 2017-07-08 DIAGNOSIS — Z0189 Encounter for other specified special examinations: Secondary | ICD-10-CM

## 2017-07-08 NOTE — Progress Notes (Deleted)
Subjective:    CC:   HPI:  Hypertension- Pt denies chest pain, SOB, dizziness, or heart palpitations.  Taking meds as directed w/o problems.  Denies medication side effects.    Impaired fasting glucose-no increased thirst or urination. No symptoms consistent with hypoglycemia.  FAtty liver disease -   Hypothyroidism-we recently adjusted her dose to 88 mcg daily.  She is due for recheck TSH.  Past medical history, Surgical history, Family history not pertinant except as noted below, Social history, Allergies, and medications have been entered into the medical record, reviewed, and corrections made.   Review of Systems: No fevers, chills, night sweats, weight loss, chest pain, or shortness of breath.   Objective:    General: Well Developed, well nourished, and in no acute distress.  Neuro: Alert and oriented x3, extra-ocular muscles intact, sensation grossly intact.  HEENT: Normocephalic, atraumatic  Skin: Warm and dry, no rashes. Cardiac: Regular rate and rhythm, no murmurs rubs or gallops, no lower extremity edema.  Respiratory: Clear to auscultation bilaterally. Not using accessory muscles, speaking in full sentences.   Impression and Recommendations:    HTN -   IFG -   FAtty liver disease -   Hypothyroidism-

## 2017-07-21 NOTE — Telephone Encounter (Signed)
Error

## 2017-07-27 ENCOUNTER — Ambulatory Visit: Payer: Medicare Other | Admitting: Family Medicine

## 2017-08-04 DIAGNOSIS — M0609 Rheumatoid arthritis without rheumatoid factor, multiple sites: Secondary | ICD-10-CM | POA: Diagnosis not present

## 2017-08-04 DIAGNOSIS — J019 Acute sinusitis, unspecified: Secondary | ICD-10-CM | POA: Diagnosis not present

## 2017-08-04 DIAGNOSIS — H6121 Impacted cerumen, right ear: Secondary | ICD-10-CM | POA: Diagnosis not present

## 2017-08-05 DIAGNOSIS — E039 Hypothyroidism, unspecified: Secondary | ICD-10-CM | POA: Diagnosis not present

## 2017-08-05 DIAGNOSIS — R7301 Impaired fasting glucose: Secondary | ICD-10-CM | POA: Diagnosis not present

## 2017-08-12 ENCOUNTER — Other Ambulatory Visit: Payer: Self-pay | Admitting: Family

## 2017-08-12 DIAGNOSIS — Z1231 Encounter for screening mammogram for malignant neoplasm of breast: Secondary | ICD-10-CM

## 2017-09-13 DIAGNOSIS — Z79899 Other long term (current) drug therapy: Secondary | ICD-10-CM | POA: Diagnosis not present

## 2017-09-13 DIAGNOSIS — H40003 Preglaucoma, unspecified, bilateral: Secondary | ICD-10-CM | POA: Diagnosis not present

## 2017-09-13 DIAGNOSIS — M069 Rheumatoid arthritis, unspecified: Secondary | ICD-10-CM | POA: Diagnosis not present

## 2017-09-13 DIAGNOSIS — H26493 Other secondary cataract, bilateral: Secondary | ICD-10-CM | POA: Diagnosis not present

## 2017-09-16 IMAGING — DX DG KNEE 1-2V*R*
4 series · 4 of 4 positions shown · non-contrast
Comparison: 03/29/2014 .

CLINICAL DATA: Chronic knee pain.  No injury.

EXAM:
RIGHT KNEE - 1-2 VIEW

[knee ap]
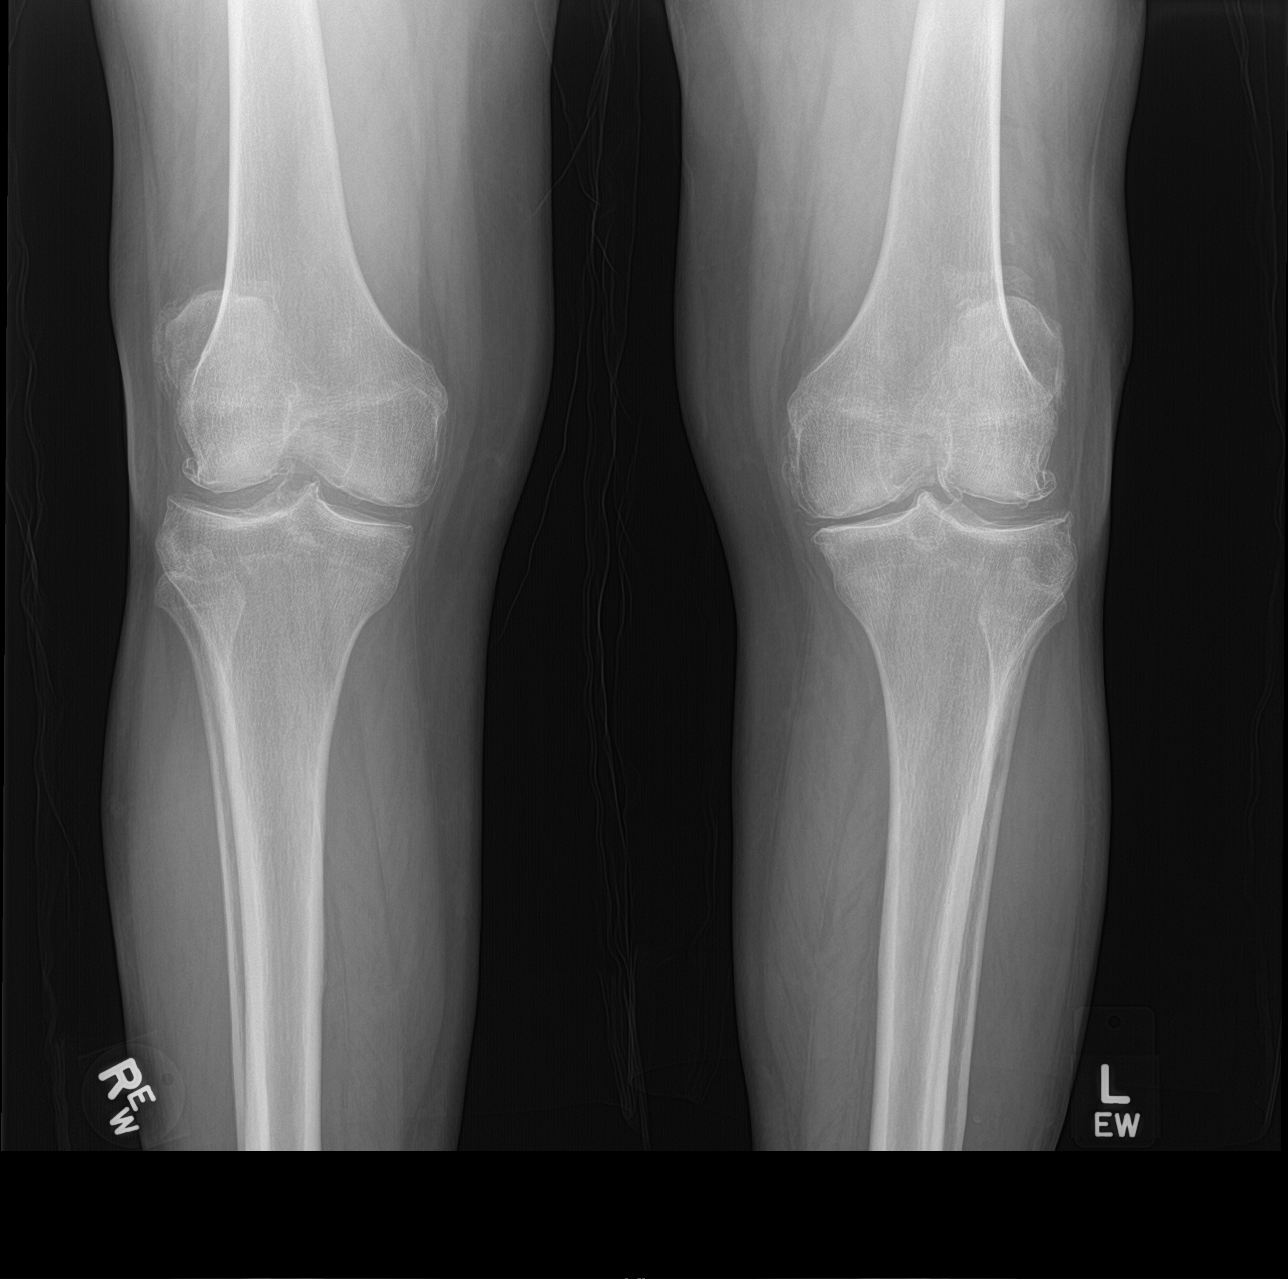

[tunnel]
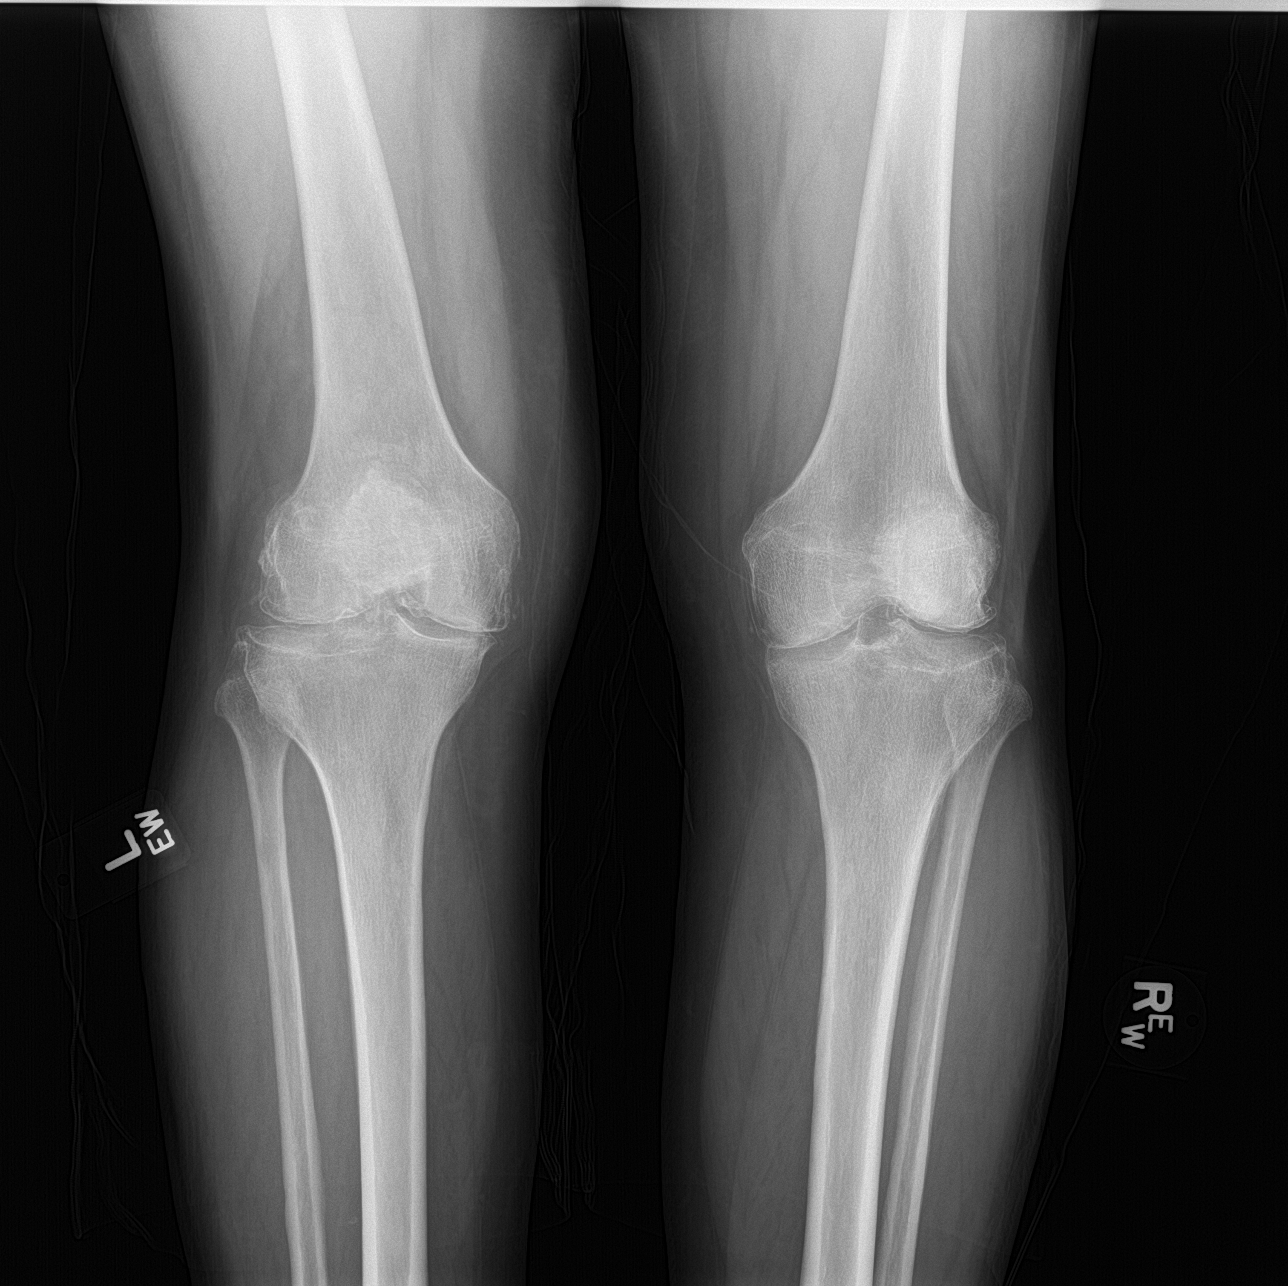

[knee lat]
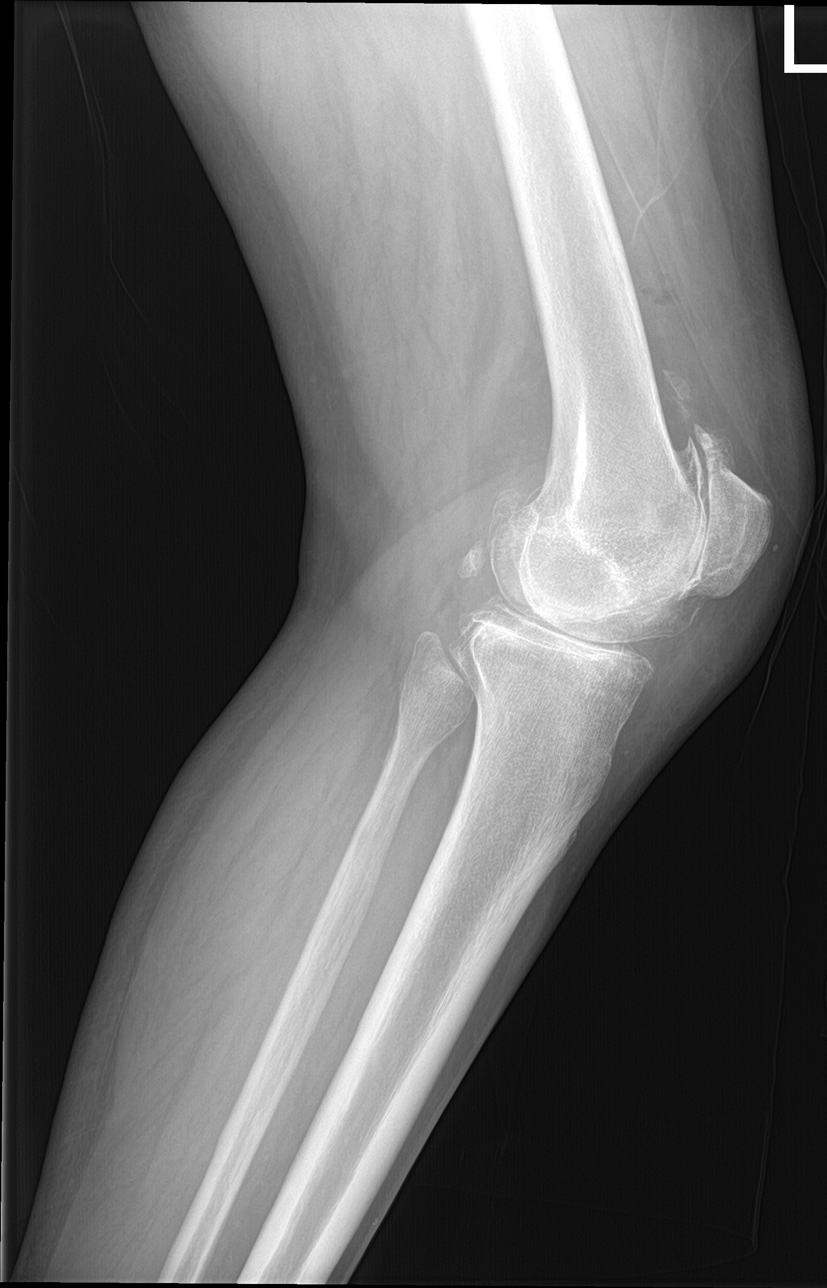

[knee sunrise]
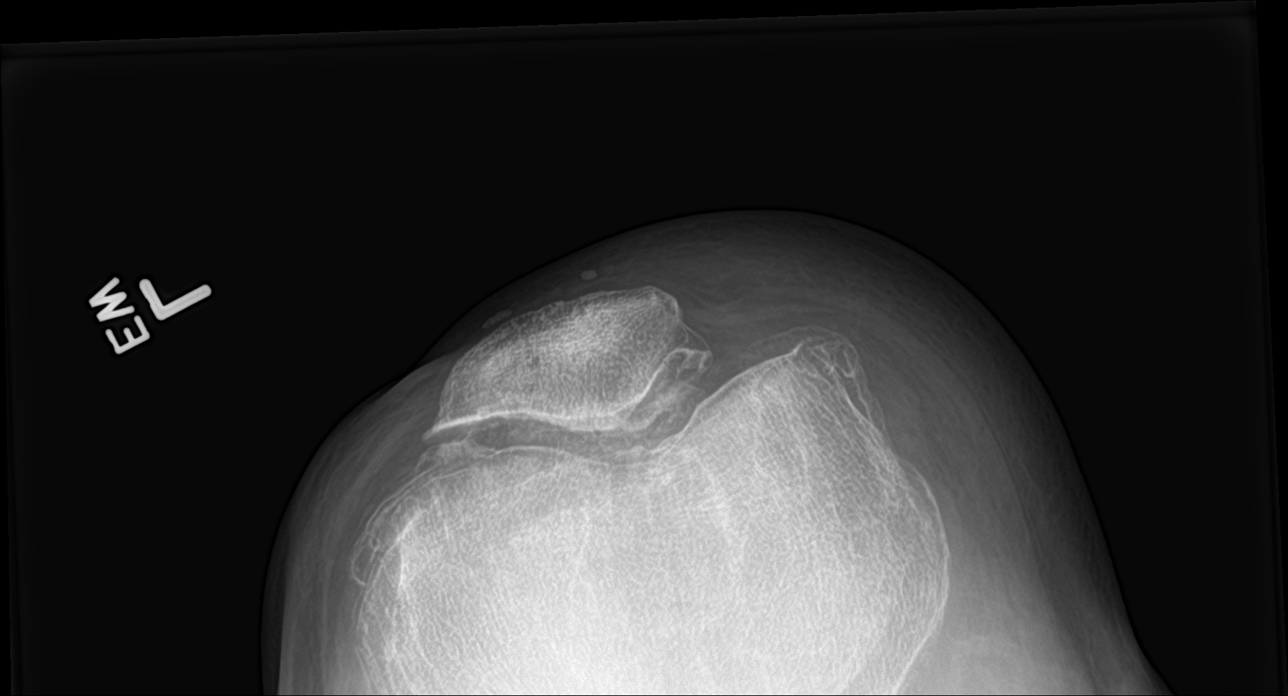

[4 of 4 positions shown; findings below may reference images not displayed]

FINDINGS: Severe bilateral tricompartment degenerative change with
chondrocalcinosis again noted. Degenerative changes have worsened
from prior exam. No evidence of fracture or dislocation. Knee joint
effusion containing fat and/or air on the left cannot be excluded.
MRI of the left knee is suggested to further evaluate.
IMPRESSION: 1. Severe bilateral tricompartment degenerative change with
chondrocalcinosis again noted. Degenerative changes have worsened
from prior exam. No evidence of fracture or dislocation.

2. Left knee joint effusion containing fat and/or air cannot be
excluded . MRI of the left knee suggested for further evaluation.

## 2017-09-22 DIAGNOSIS — M150 Primary generalized (osteo)arthritis: Secondary | ICD-10-CM | POA: Diagnosis not present

## 2017-09-22 DIAGNOSIS — Z79899 Other long term (current) drug therapy: Secondary | ICD-10-CM | POA: Diagnosis not present

## 2017-09-22 DIAGNOSIS — M0579 Rheumatoid arthritis with rheumatoid factor of multiple sites without organ or systems involvement: Secondary | ICD-10-CM | POA: Diagnosis not present

## 2017-09-24 ENCOUNTER — Ambulatory Visit (INDEPENDENT_AMBULATORY_CARE_PROVIDER_SITE_OTHER): Payer: Medicare Other

## 2017-09-24 DIAGNOSIS — Z1231 Encounter for screening mammogram for malignant neoplasm of breast: Secondary | ICD-10-CM

## 2017-10-08 ENCOUNTER — Other Ambulatory Visit: Payer: Self-pay | Admitting: Family Medicine

## 2017-10-08 DIAGNOSIS — E038 Other specified hypothyroidism: Secondary | ICD-10-CM

## 2017-12-22 DIAGNOSIS — M0579 Rheumatoid arthritis with rheumatoid factor of multiple sites without organ or systems involvement: Secondary | ICD-10-CM | POA: Diagnosis not present

## 2017-12-22 DIAGNOSIS — Z79899 Other long term (current) drug therapy: Secondary | ICD-10-CM | POA: Diagnosis not present

## 2017-12-22 DIAGNOSIS — M150 Primary generalized (osteo)arthritis: Secondary | ICD-10-CM | POA: Diagnosis not present

## 2018-01-11 DIAGNOSIS — Z79899 Other long term (current) drug therapy: Secondary | ICD-10-CM | POA: Diagnosis not present

## 2018-01-11 DIAGNOSIS — B029 Zoster without complications: Secondary | ICD-10-CM | POA: Diagnosis not present

## 2018-01-11 DIAGNOSIS — M0609 Rheumatoid arthritis without rheumatoid factor, multiple sites: Secondary | ICD-10-CM | POA: Diagnosis not present

## 2018-01-11 DIAGNOSIS — M159 Polyosteoarthritis, unspecified: Secondary | ICD-10-CM | POA: Diagnosis not present

## 2018-01-18 DIAGNOSIS — Z0000 Encounter for general adult medical examination without abnormal findings: Secondary | ICD-10-CM | POA: Diagnosis not present

## 2018-01-24 LAB — FECAL OCCULT BLOOD, GUAIAC: FECAL OCCULT BLD: NEGATIVE

## 2018-02-03 ENCOUNTER — Encounter: Payer: Self-pay | Admitting: Family Medicine

## 2018-03-23 DIAGNOSIS — Z79899 Other long term (current) drug therapy: Secondary | ICD-10-CM | POA: Diagnosis not present

## 2018-03-23 DIAGNOSIS — M0579 Rheumatoid arthritis with rheumatoid factor of multiple sites without organ or systems involvement: Secondary | ICD-10-CM | POA: Diagnosis not present

## 2018-03-23 DIAGNOSIS — M150 Primary generalized (osteo)arthritis: Secondary | ICD-10-CM | POA: Diagnosis not present

## 2018-04-01 DIAGNOSIS — E0590 Thyrotoxicosis, unspecified without thyrotoxic crisis or storm: Secondary | ICD-10-CM | POA: Diagnosis not present

## 2018-06-21 DIAGNOSIS — Z79899 Other long term (current) drug therapy: Secondary | ICD-10-CM | POA: Diagnosis not present

## 2018-06-21 DIAGNOSIS — M0579 Rheumatoid arthritis with rheumatoid factor of multiple sites without organ or systems involvement: Secondary | ICD-10-CM | POA: Diagnosis not present

## 2018-06-21 DIAGNOSIS — M150 Primary generalized (osteo)arthritis: Secondary | ICD-10-CM | POA: Diagnosis not present

## 2018-06-21 DIAGNOSIS — R945 Abnormal results of liver function studies: Secondary | ICD-10-CM | POA: Diagnosis not present

## 2018-06-26 ENCOUNTER — Other Ambulatory Visit: Payer: Self-pay | Admitting: Family Medicine

## 2018-06-26 DIAGNOSIS — E038 Other specified hypothyroidism: Secondary | ICD-10-CM

## 2018-08-15 ENCOUNTER — Other Ambulatory Visit: Payer: Self-pay | Admitting: Family

## 2018-08-15 DIAGNOSIS — Z1231 Encounter for screening mammogram for malignant neoplasm of breast: Secondary | ICD-10-CM

## 2018-09-28 ENCOUNTER — Other Ambulatory Visit: Payer: Self-pay

## 2018-09-28 ENCOUNTER — Ambulatory Visit (INDEPENDENT_AMBULATORY_CARE_PROVIDER_SITE_OTHER): Payer: Medicare Other

## 2018-09-28 DIAGNOSIS — Z1231 Encounter for screening mammogram for malignant neoplasm of breast: Secondary | ICD-10-CM | POA: Diagnosis not present

## 2018-12-06 DIAGNOSIS — M79601 Pain in right arm: Secondary | ICD-10-CM | POA: Diagnosis not present

## 2018-12-06 DIAGNOSIS — R61 Generalized hyperhidrosis: Secondary | ICD-10-CM | POA: Diagnosis not present

## 2018-12-06 DIAGNOSIS — R000 Tachycardia, unspecified: Secondary | ICD-10-CM | POA: Diagnosis not present

## 2018-12-06 DIAGNOSIS — Z03818 Encounter for observation for suspected exposure to other biological agents ruled out: Secondary | ICD-10-CM | POA: Diagnosis not present

## 2018-12-06 DIAGNOSIS — R002 Palpitations: Secondary | ICD-10-CM | POA: Diagnosis not present

## 2018-12-06 DIAGNOSIS — E039 Hypothyroidism, unspecified: Secondary | ICD-10-CM | POA: Diagnosis not present

## 2018-12-06 DIAGNOSIS — Z79899 Other long term (current) drug therapy: Secondary | ICD-10-CM | POA: Diagnosis not present

## 2018-12-09 ENCOUNTER — Ambulatory Visit: Payer: Medicare Other | Admitting: Family Medicine

## 2018-12-09 ENCOUNTER — Ambulatory Visit (INDEPENDENT_AMBULATORY_CARE_PROVIDER_SITE_OTHER): Payer: Medicare Other

## 2018-12-09 ENCOUNTER — Other Ambulatory Visit: Payer: Self-pay

## 2018-12-09 ENCOUNTER — Encounter: Payer: Self-pay | Admitting: Family Medicine

## 2018-12-09 VITALS — BP 133/72 | HR 96 | Temp 98.2°F

## 2018-12-09 DIAGNOSIS — E038 Other specified hypothyroidism: Secondary | ICD-10-CM

## 2018-12-09 DIAGNOSIS — M25511 Pain in right shoulder: Secondary | ICD-10-CM | POA: Diagnosis not present

## 2018-12-09 DIAGNOSIS — D229 Melanocytic nevi, unspecified: Secondary | ICD-10-CM | POA: Diagnosis not present

## 2018-12-09 DIAGNOSIS — M059 Rheumatoid arthritis with rheumatoid factor, unspecified: Secondary | ICD-10-CM

## 2018-12-09 LAB — HEPATIC FUNCTION PANEL
ALT: 41 — AB (ref 7–35)
AST: 48 — AB (ref 13–35)
Alkaline Phosphatase: 53 (ref 25–125)
Bilirubin, Total: 0.5

## 2018-12-09 LAB — BASIC METABOLIC PANEL
BUN: 21 (ref 4–21)
Creatinine: 0.8 (ref 0.5–1.1)
Glucose: 130
Potassium: 4.4 (ref 3.4–5.3)
Sodium: 142 (ref 137–147)

## 2018-12-09 NOTE — Assessment & Plan Note (Signed)
Last TSH done at the emergency department showed a TSH of 2.4 which is perfect.  Looks fantastic.  Follow-up in 6 months.

## 2018-12-09 NOTE — Progress Notes (Signed)
Established Patient Office Visit  Subjective:  Patient ID: Kiara Clayton, female    DOB: [birth year]  Age: 78 y.o. MRN: [id number]  CC:  Chief Complaint  Patient presents with  . Hospitalization Follow-up    HPI Kiara Clayton presents for right shoulder pain.  She says she actually went to urgent care initially on May 19 for right arm and shoulder pain.  But while there she was tachycardic.  She says she was completely asymptomatic did not have any chest pain shortness of breath palpitations or flutters but because her pulse was in the 120s they actually transferred her to the emergency department she went for further evaluation and essentially had a negative work-up and the tachycardia seemed to resolve on its own.  She was not diagnosed with any arrhythmia.  Not had any symptoms since then either.  She denies any chest pain shortness of breath lightheadedness or dizziness.  She has had right upper arm and shoulder pain for about 3 weeks at this point.  She says the pain in intensity seems to come and go she has not found any specific triggers.  She denies any injury or trauma.  Notices that it feels tight if she reaches behind her.  She is not sure if they did an x-ray or not.  Past Medical History:  Diagnosis Date  . Allergic rhinitis   . Asthma, chronic   . Diphtheria   . Essential hypertension, benign   . Fibromyalgia    See Dr. Charlestine Night (rhuem) in [place]. Uses Aleve and cyclobenzaprine and lyrica.  Cyclobenzaprine no longer covered as of 2015 by [facility].   Marland Kitchen Hearing loss   . Heart murmur   . Hypercalcemia   . Hypothyroid   . IFG (impaired fasting glucose)   . Palate deformity   . Post herpetic neuralgia    On Lyrica.  Sees Dr. Hurley Cisco (rheumatology).     . Rheumatoid arthritis ([place])   . Trochanteric bursitis of right hip     Past Surgical History:  Procedure Laterality Date  . BREAST BIOPSY  1988   Dr. Bubba Camp, no cancer.   Marland Kitchen [place]   . LUMBAR DISC SURGERY  1990   Dr. Verdene Lennert, [place]  . TONSILLECTOMY AND ADENOIDECTOMY     As a child   . TOTAL ABDOMINAL HYSTERECTOMY W/ BILATERAL SALPINGOOPHORECTOMY  1988    Family History  Adopted: Yes  Problem Relation Age of Onset  . Alcohol abuse Sister   . Stomach cancer Maternal Aunt   . Brain cancer Mother   . Stroke Mother        deceased  . Diabetes Mother   . Hypertension Mother   . COPD Father   . Heart disease Brother        Unknown    Social History   Socioeconomic History  . Marital status: Widowed    Spouse name: Not on file  . Number of children: 0  . Years of education: Not on file  . Highest education level: Not on file  Occupational History  . Occupation: retired Production manager  Social Needs  . Financial resource strain: Not on file  . Food insecurity:    Worry: Not on file    Inability: Not on file  . Transportation needs:    Medical: Not on file    Non-medical: Not on file  Tobacco Use  . Smoking status: Former Smoker  Last attempt to quit: 07/20/1968    Years since quitting: 50.4  . Smokeless tobacco: Former Systems developer    Quit date: 07/20/1968  Substance and Sexual Activity  . Alcohol use: Yes    Alcohol/week: 1.0 standard drinks    Types: 1 drink(s) per week    Comment: 2-3 glasse wine per week  . Drug use: No  . Sexual activity: Not on file  Lifestyle  . Physical activity:    Days per week: Not on file    Minutes per session: Not on file  . Stress: Not on file  Relationships  . Social connections:    Talks on phone: Not on file    Gets together: Not on file    Attends religious service: Not on file    Active member of club or organization: Not on file    Attends meetings of clubs or organizations: Not on file    Relationship status: Not on file  . Intimate partner violence:    Fear of current or ex partner: Not on file    Emotionally abused: Not on file    Physically abused: Not on file    Forced sexual activity: Not  on file  Other Topics Concern  . Not on file  Social History Narrative   3 caffeinated drinks per day   No regular exericse.     Outpatient Medications Prior to Visit  Medication Sig Dispense Refill  . acetaminophen (TYLENOL) 500 MG tablet Take 1,000 mg by mouth every 6 (six) hours as needed.    . celecoxib (CELEBREX) 200 MG capsule One to 2 tablets by mouth daily as needed for pain. 60 capsule 2  . cyclobenzaprine (FLEXERIL) 10 MG tablet TAKE 1/2 TO 1 TABLET(5 TO 10 MG) BY MOUTH TWICE DAILY AS NEEDED 180 tablet 0  . hydroxychloroquine (PLAQUENIL) 200 MG tablet Take 200 mg by mouth daily.    Marland Kitchen levothyroxine (SYNTHROID, LEVOTHROID) 88 MCG tablet TAKE 1 TABLET(88 MCG) BY MOUTH DAILY BEFORE BREAKFAST 30 tablet 0  . NON FORMULARY Joint pill 1 tab po qd     No facility-administered medications prior to visit.     Allergies  Allergen Reactions  . Asa [Aspirin]   . Peanuts [Peanut Oil] Swelling  . Penicillins     ROS Review of Systems    Objective:    Physical Exam  Constitutional: She is oriented to person, place, and time. She appears well-developed and well-nourished.  HENT:  Head: Normocephalic and atraumatic.  Cardiovascular: Normal rate, regular rhythm and normal heart sounds.  Pulmonary/Chest: Effort normal and breath sounds normal.  Musculoskeletal:     Comments: Right shoulder with normal range of motion.  She had a little bit of pain and discomfort reaching behind her waist but was able to do so.  No significant limitations or pain with external rotation.  She is just a little bit tender over the lateral part of the upper shoulder.  Nontender over the scapula or clavicle.  Neurological: She is alert and oriented to person, place, and time.  Skin: Skin is warm and dry.  She does have a small dark black appearing mole on her right upper shoulder.  No worrisome features.  It is symmetric and small approximately 3 mm in size.  Psychiatric: She has a normal mood and affect.  Her behavior is normal.    BP 133/72   Pulse 96   Temp 98.2 F (36.8 C) (Oral)  Wt Readings from Last 3 Encounters:  04/09/17  190 lb (86.2 kg)  03/17/17 187 lb 9.6 oz (85.1 kg)  02/17/17 185 lb (83.9 kg)     Health Maintenance Due  Topic Date Due  . DEXA SCAN  12/09/2007  . TETANUS/TDAP  07/21/2015    There are no preventive care reminders to display for this patient.  Lab Results  Component Value Date   TSH 0.23 (L) 06/04/2017   Lab Results  Component Value Date   WBC 5.2 04/05/2017   HGB 14.4 04/05/2017   HCT 41.8 04/05/2017   MCV 87.8 04/05/2017   PLT 239 04/05/2017   Lab Results  Component Value Date   NA 141 04/05/2017   K 3.9 04/05/2017   CO2 27 04/05/2017   GLUCOSE 123 (H) 04/05/2017   BUN 13 04/05/2017   CREATININE 0.65 04/05/2017   BILITOT 0.5 04/05/2017   ALKPHOS 51 02/12/2014   AST 21 04/05/2017   ALT 23 04/05/2017   PROT 7.1 04/05/2017   ALBUMIN 4.1 02/12/2014   CALCIUM 10.1 04/05/2017   Lab Results  Component Value Date   CHOL 225 (H) 04/05/2017   Lab Results  Component Value Date   HDL 75 04/05/2017   Lab Results  Component Value Date   LDLCALC 130 (H) 04/05/2017   Lab Results  Component Value Date   TRIG 95 04/05/2017   Lab Results  Component Value Date   CHOLHDL 3.0 04/05/2017   Lab Results  Component Value Date   HGBA1C 5.6 04/05/2017      Assessment & Plan:   Problem List Items Addressed This Visit      Endocrine   Hypothyroid    Last TSH done at the emergency department showed a TSH of 2.4 which is perfect.  Looks fantastic.  Follow-up in 6 months.        Musculoskeletal and Integument   Rheumatoid arthritis [facility])    Following with rheumatology.       Other Visit Diagnoses    Acute pain of right shoulder    -  Primary   Relevant Orders   DG Shoulder Right   Atypical nevus         Right shoulder pain-I suspect bursitis based on her exam and history.  Gave her exercises to do on her own at home we will  go ahead and get an x-ray as well as she is quite concerned and feels that she needs 1.  She does like has good range of motion so I do not think she has a torn rotator cuff.  She Artie takes Celebrex as needed for her rheumatoid.  Atypical nevus-gave reassurance that the mole on her upper arm looks okay overall it does not look worrisome or suspicious but I did want her to keep an eye on it if she feels any point that is getting larger we can always do a biopsy.  The tachycardia that she was experiencing seems to have resolved.  He was completely asymptomatic while that occurred and has been completely asymptomatic since then.  No orders of the defined types were placed in this encounter.   Follow-up: Return in about 6 months (around 06/11/2019) for BP and check thyroid .    Beatrice Lecher, MD

## 2018-12-09 NOTE — Assessment & Plan Note (Signed)
Following with rheumatology

## 2018-12-26 DIAGNOSIS — M0579 Rheumatoid arthritis with rheumatoid factor of multiple sites without organ or systems involvement: Secondary | ICD-10-CM | POA: Diagnosis not present

## 2018-12-26 DIAGNOSIS — M8949 Other hypertrophic osteoarthropathy, multiple sites: Secondary | ICD-10-CM | POA: Diagnosis not present

## 2018-12-26 DIAGNOSIS — Z79899 Other long term (current) drug therapy: Secondary | ICD-10-CM | POA: Diagnosis not present

## 2019-01-27 ENCOUNTER — Encounter: Payer: Self-pay | Admitting: Family Medicine

## 2019-04-25 DIAGNOSIS — M8949 Other hypertrophic osteoarthropathy, multiple sites: Secondary | ICD-10-CM | POA: Diagnosis not present

## 2019-04-25 DIAGNOSIS — Z79899 Other long term (current) drug therapy: Secondary | ICD-10-CM | POA: Diagnosis not present

## 2019-04-25 DIAGNOSIS — M0579 Rheumatoid arthritis with rheumatoid factor of multiple sites without organ or systems involvement: Secondary | ICD-10-CM | POA: Diagnosis not present

## 2019-06-14 ENCOUNTER — Ambulatory Visit: Payer: Medicare Other | Admitting: Family Medicine

## 2019-07-25 DIAGNOSIS — Q359 Cleft palate, unspecified: Secondary | ICD-10-CM | POA: Diagnosis not present

## 2019-07-25 DIAGNOSIS — R1310 Dysphagia, unspecified: Secondary | ICD-10-CM | POA: Diagnosis not present

## 2019-07-27 DIAGNOSIS — H6980 Other specified disorders of Eustachian tube, unspecified ear: Secondary | ICD-10-CM | POA: Diagnosis not present

## 2019-07-27 DIAGNOSIS — Q353 Cleft soft palate: Secondary | ICD-10-CM | POA: Diagnosis not present

## 2019-07-28 DIAGNOSIS — R1310 Dysphagia, unspecified: Secondary | ICD-10-CM | POA: Diagnosis not present

## 2019-08-01 DIAGNOSIS — K222 Esophageal obstruction: Secondary | ICD-10-CM | POA: Diagnosis not present

## 2019-08-01 DIAGNOSIS — R1310 Dysphagia, unspecified: Secondary | ICD-10-CM | POA: Diagnosis not present

## 2019-08-22 ENCOUNTER — Other Ambulatory Visit: Payer: Self-pay | Admitting: Family

## 2019-08-22 DIAGNOSIS — Z1231 Encounter for screening mammogram for malignant neoplasm of breast: Secondary | ICD-10-CM

## 2019-10-04 ENCOUNTER — Ambulatory Visit (INDEPENDENT_AMBULATORY_CARE_PROVIDER_SITE_OTHER): Payer: Medicare Other

## 2019-10-04 ENCOUNTER — Other Ambulatory Visit: Payer: Self-pay

## 2019-10-04 DIAGNOSIS — Z1231 Encounter for screening mammogram for malignant neoplasm of breast: Secondary | ICD-10-CM | POA: Diagnosis not present

## 2019-10-24 DIAGNOSIS — M8949 Other hypertrophic osteoarthropathy, multiple sites: Secondary | ICD-10-CM | POA: Diagnosis not present

## 2019-10-24 DIAGNOSIS — M0579 Rheumatoid arthritis with rheumatoid factor of multiple sites without organ or systems involvement: Secondary | ICD-10-CM | POA: Diagnosis not present

## 2019-10-24 DIAGNOSIS — Z79899 Other long term (current) drug therapy: Secondary | ICD-10-CM | POA: Diagnosis not present

## 2019-10-31 ENCOUNTER — Other Ambulatory Visit: Payer: Self-pay

## 2019-10-31 ENCOUNTER — Ambulatory Visit (INDEPENDENT_AMBULATORY_CARE_PROVIDER_SITE_OTHER): Payer: Medicare Other | Admitting: Sports Medicine

## 2019-10-31 DIAGNOSIS — M5136 Other intervertebral disc degeneration, lumbar region: Secondary | ICD-10-CM | POA: Diagnosis not present

## 2019-10-31 DIAGNOSIS — M51369 Other intervertebral disc degeneration, lumbar region without mention of lumbar back pain or lower extremity pain: Secondary | ICD-10-CM | POA: Insufficient documentation

## 2019-10-31 MED ORDER — PREDNISONE 50 MG PO TABS: ORAL_TABLET | ORAL | 0 refills | Status: DC

## 2019-10-31 MED ORDER — MELOXICAM 15 MG PO TABS: ORAL_TABLET | ORAL | 3 refills | Status: DC

## 2019-10-31 NOTE — Assessment & Plan Note (Signed)
This is a 79 year old female with chronic low back pain, she has L4-L5 DDD on x-rays, last done several years ago, she does have history of what sounds to be a lumbar laminectomy for spinal stenosis. She is now having recurrence of pain in her back, with radiation down the left leg, with cramping, in no specific distribution. We are going to start conservatively with prednisone, and we are to switch from Celebrex to meloxicam. I like to see her back in about 3 weeks, we can proceed with an MRI for interventional planning if no better.

## 2019-10-31 NOTE — Progress Notes (Signed)
    Procedures performed today:    None.  Independent interpretation of notes and tests performed by another provider:   I personally reviewed her lumbar spine x-rays, she has severe L4-L5 DDD with loss of disc height, she has similar findings but to a lesser degree at every other level.  Brief History, Exam, Impression, and Recommendations:    Lumbar degenerative disc disease This is a 79 year old female with chronic low back pain, she has L4-L5 DDD on x-rays, last done several years ago, she does have history of what sounds to be a lumbar laminectomy for spinal stenosis. She is now having recurrence of pain in her back, with radiation down the left leg, with cramping, in no specific distribution. We are going to start conservatively with prednisone, and we are to switch from Celebrex to meloxicam. I like to see her back in about 3 weeks, we can proceed with an MRI for interventional planning if no better.    ___________________________________________ Gwen Her. Dianah Field, M.D., ABFM., CAQSM. Primary Care and [place] Instructor of [place] of [facility] of Medicine

## 2019-11-21 ENCOUNTER — Other Ambulatory Visit: Payer: Self-pay

## 2019-11-21 ENCOUNTER — Ambulatory Visit (INDEPENDENT_AMBULATORY_CARE_PROVIDER_SITE_OTHER): Payer: Medicare Other | Admitting: Sports Medicine

## 2019-11-21 DIAGNOSIS — M51369 Other intervertebral disc degeneration, lumbar region without mention of lumbar back pain or lower extremity pain: Secondary | ICD-10-CM

## 2019-11-21 DIAGNOSIS — M5136 Other intervertebral disc degeneration, lumbar region: Secondary | ICD-10-CM | POA: Diagnosis not present

## 2019-11-21 MED ORDER — PREDNISONE 10 MG (48) PO TBPK: ORAL_TABLET | Freq: Every day | ORAL | 0 refills | Status: DC

## 2019-11-21 NOTE — Progress Notes (Signed)
    Procedures performed today:    None.  Independent interpretation of notes and tests performed by another provider:   None.  Brief History, Exam, Impression, and Recommendations:    Lumbar degenerative disc disease This 79 year old female returns, she has chronic low back pain, with L4-L5 DDD on x-rays. She also has what sounds to be a lumbar laminectomy for spinal stenosis. At the last visit she was having recurrence of back pain with radiation down the left leg with cramping, this resolved with 5 days of prednisone, meloxicam. Unfortunately she is having a slight recurrence of her symptoms so we will proceed with an additional 12-day prednisone taper, she does desire to hold off on MRI and epidural for now, if insufficient relief by the follow-up in 2 weeks we will most certainly proceed with MRI and epidural.    ___________________________________________ Gwen Her. Dianah Field, M.D., ABFM., CAQSM. Primary Care and [place] Instructor of [place] of [facility] of Medicine

## 2019-11-21 NOTE — Assessment & Plan Note (Signed)
This 79 year old female returns, she has chronic low back pain, with L4-L5 DDD on x-rays. She also has what sounds to be a lumbar laminectomy for spinal stenosis. At the last visit she was having recurrence of back pain with radiation down the left leg with cramping, this resolved with 5 days of prednisone, meloxicam. Unfortunately she is having a slight recurrence of her symptoms so we will proceed with an additional 12-day prednisone taper, she does desire to hold off on MRI and epidural for now, if insufficient relief by the follow-up in 2 weeks we will most certainly proceed with MRI and epidural.

## 2019-11-30 ENCOUNTER — Other Ambulatory Visit: Payer: Self-pay | Admitting: Sports Medicine

## 2019-11-30 DIAGNOSIS — M5136 Other intervertebral disc degeneration, lumbar region: Secondary | ICD-10-CM

## 2019-12-05 ENCOUNTER — Ambulatory Visit (INDEPENDENT_AMBULATORY_CARE_PROVIDER_SITE_OTHER): Payer: Medicare Other | Admitting: Sports Medicine

## 2019-12-05 DIAGNOSIS — M5136 Other intervertebral disc degeneration, lumbar region: Secondary | ICD-10-CM | POA: Diagnosis not present

## 2019-12-05 DIAGNOSIS — M51369 Other intervertebral disc degeneration, lumbar region without mention of lumbar back pain or lower extremity pain: Secondary | ICD-10-CM

## 2019-12-05 NOTE — Assessment & Plan Note (Signed)
Reyhana returns, she has chronic low back pain with L4-L5 DDD on x-rays. She did have what sounded to be a lumbar laminectomy for spinal stenosis, at this point we have done both a burst and a 12-day taper of prednisone and she had good relief of her symptoms, her back to little bit stiff but she has no further radicular discomfort. At this point she is happy before things are and does not desire to proceed with MRI or epidurals. She does decline other oral medications, and declines physical therapy. Return to see me as needed.

## 2019-12-05 NOTE — Progress Notes (Signed)
    Procedures performed today:    None.  Independent interpretation of notes and tests performed by another provider:   None.  Brief History, Exam, Impression, and Recommendations:    Lumbar degenerative disc disease Kiara Clayton returns, she has chronic low back pain with L4-L5 DDD on x-rays. She did have what sounded to be a lumbar laminectomy for spinal stenosis, at this point we have done both a burst and a 12-day taper of prednisone and she had good relief of her symptoms, her back to little bit stiff but she has no further radicular discomfort. At this point she is happy before things are and does not desire to proceed with MRI or epidurals. She does decline other oral medications, and declines physical therapy. Return to see me as needed.    ___________________________________________ Gwen Her. Dianah Field, M.D., ABFM., CAQSM. Primary Care and [place] Instructor of [place] of [facility] of Medicine

## 2020-02-13 ENCOUNTER — Telehealth: Payer: Self-pay | Admitting: Family Medicine

## 2020-02-13 DIAGNOSIS — M51369 Other intervertebral disc degeneration, lumbar region without mention of lumbar back pain or lower extremity pain: Secondary | ICD-10-CM

## 2020-02-13 DIAGNOSIS — M5136 Other intervertebral disc degeneration, lumbar region: Secondary | ICD-10-CM

## 2020-02-13 MED ORDER — MELOXICAM 15 MG PO TABS: ORAL_TABLET | ORAL | 3 refills | Status: AC

## 2020-02-13 NOTE — Telephone Encounter (Signed)
Needs appt for her thyroid.  I haven't seen her in over a year.

## 2020-02-13 NOTE — Telephone Encounter (Signed)
Patient advised that Dr. Darene Lamer refilled her Meloxicam, and patient follow up appt set up with Dr. Madilyn Fireman (PCP) to follow up on thyroid, for 02/20/2020 at 1 pm.

## 2020-02-13 NOTE — Telephone Encounter (Signed)
I refilled meloxicam, levothyroxine management per PCP.

## 2020-02-13 NOTE — Telephone Encounter (Signed)
Patient needed a refill on medication:  levothyroxine (SYNTHROID, LEVOTHROID) 88 MCG tablet  and was questioning should she still be on & if so she needs a refill on it too:  meloxicam (MOBIC) 15 MG tablet  also stated left knee was still popping when she wakes up and was questioning what to do about it.   [id number] - [place], [place]

## 2020-02-20 ENCOUNTER — Ambulatory Visit: Payer: Medicare Other | Admitting: Family Medicine

## 2020-02-20 ENCOUNTER — Encounter: Payer: Self-pay | Admitting: Family Medicine

## 2020-02-20 ENCOUNTER — Other Ambulatory Visit: Payer: Self-pay

## 2020-02-20 VITALS — BP 110/73 | HR 90 | Ht 65.0 in | Wt 193.0 lb

## 2020-02-20 DIAGNOSIS — R7301 Impaired fasting glucose: Secondary | ICD-10-CM

## 2020-02-20 DIAGNOSIS — I10 Essential (primary) hypertension: Secondary | ICD-10-CM | POA: Diagnosis not present

## 2020-02-20 DIAGNOSIS — M059 Rheumatoid arthritis with rheumatoid factor, unspecified: Secondary | ICD-10-CM

## 2020-02-20 DIAGNOSIS — E038 Other specified hypothyroidism: Secondary | ICD-10-CM | POA: Diagnosis not present

## 2020-02-20 DIAGNOSIS — R011 Cardiac murmur, unspecified: Secondary | ICD-10-CM

## 2020-02-20 LAB — POCT GLYCOSYLATED HEMOGLOBIN (HGB A1C): Hemoglobin A1C: 6.9 % — AB (ref 4.0–5.6)

## 2020-02-20 NOTE — Progress Notes (Signed)
Established Patient Office Visit  Subjective:  Patient ID: Kiara Clayton, female    DOB: [birth year]  Age: 79 y.o. MRN: [id number]  CC:  Chief Complaint  Patient presents with  . Hyperlipidemia  . Hypothyroidism  . ifg    HPI Kiara Clayton presents for   Hypothyroidism - Taking medication regularly in the AM away from food and vitamins, etc. No recent change to skin,or energy levels.  She does feel like she is losing a little bit more hair it is just sort of generalized is not in a localized area.  Impaired fasting glucose-no increased thirst or urination. No symptoms consistent with hypoglycemia.   She did let me know that she is now seeing Dr. Posey Pronto here locally for rheumatology she did change rheumatologists.  She says she still on the same prescription medication she does feel like it is helpful some with the pain but she still gets a lot of swelling in her joints.  Also recently she has been seeing one of our sports medicine provider particularly for pain in her legs and hips.  She says it is worse if she sits for long period even 30 minutes causes a lot of discomfort.   Past Medical History:  Diagnosis Date  . Allergic rhinitis   . Asthma, chronic   . Diphtheria   . Essential hypertension, benign   . Fibromyalgia    See Dr. Charlestine Night (rhuem) in [place]. Uses Aleve and cyclobenzaprine and lyrica.  Cyclobenzaprine no longer covered as of 2015 by [facility].   Marland Kitchen Hearing loss   . Heart murmur   . Hypercalcemia   . Hypothyroid   . IFG (impaired fasting glucose)   . Palate deformity   . Post herpetic neuralgia    On Lyrica.  Sees Dr. Hurley Cisco (rheumatology).     . Rheumatoid arthritis ([place])   . Trochanteric bursitis of right hip     Past Surgical History:  Procedure Laterality Date  . BREAST BIOPSY  1988   Dr. Bubba Camp, no cancer.   Marland Kitchen [place]  . LUMBAR DISC SURGERY  1990   Dr. Verdene Lennert, [place]  . TONSILLECTOMY AND  ADENOIDECTOMY     As a child   . TOTAL ABDOMINAL HYSTERECTOMY W/ BILATERAL SALPINGOOPHORECTOMY  1988    Family History  Adopted: Yes  Problem Relation Age of Onset  . Alcohol abuse Sister   . Stomach cancer Maternal Aunt   . Brain cancer Mother   . Stroke Mother        deceased  . Diabetes Mother   . Hypertension Mother   . COPD Father   . Heart disease Brother        Unknown    Social History   Socioeconomic History  . Marital status: Widowed    Spouse name: Not on file  . Number of children: 0  . Years of education: Not on file  . Highest education level: Not on file  Occupational History  . Occupation: retired Production manager  Tobacco Use  . Smoking status: Former Smoker    Quit date: 07/20/1968    Years since quitting: 51.6  . Smokeless tobacco: Former Systems developer    Quit date: 07/20/1968  Substance and Sexual Activity  . Alcohol use: Yes    Alcohol/week: 1.0 standard drink    Types: 1 drink(s) per week    Comment: 2-3 glasse wine per week  . Drug use: No  .  Sexual activity: Not on file  Other Topics Concern  . Not on file  Social History Narrative   3 caffeinated drinks per day   No regular exericse.    Social Determinants of Health   Financial Resource Strain:   . Difficulty of Paying Living Expenses:   Food Insecurity:   . Worried About Charity fundraiser in the Last Year:   . Arboriculturist in the Last Year:   Transportation Needs:   . Film/video editor (Medical):   Marland Kitchen Lack of Transportation (Non-Medical):   Physical Activity:   . Days of Exercise per Week:   . Minutes of Exercise per Session:   Stress:   . Feeling of Stress :   Social Connections:   . Frequency of Communication with Friends and Family:   . Frequency of Social Gatherings with Friends and Family:   . Attends Religious Services:   . Active Member of Clubs or Organizations:   . Attends Archivist Meetings:   Marland Kitchen Marital Status:   Intimate Partner Violence:   . Fear of  Current or Ex-Partner:   . Emotionally Abused:   Marland Kitchen Physically Abused:   . Sexually Abused:     Outpatient Medications Prior to Visit  Medication Sig Dispense Refill  . acetaminophen (TYLENOL) 500 MG tablet Take 1,000 mg by mouth every 6 (six) hours as needed.    . cyclobenzaprine (FLEXERIL) 10 MG tablet TAKE 1/2 TO 1 TABLET(5 TO 10 MG) BY MOUTH TWICE DAILY AS NEEDED 180 tablet 0  . hydroxychloroquine (PLAQUENIL) 200 MG tablet Take 2 tablets by mouth daily.    Marland Kitchen levothyroxine (SYNTHROID, LEVOTHROID) 88 MCG tablet TAKE 1 TABLET(88 MCG) BY MOUTH DAILY BEFORE BREAKFAST 30 tablet 0  . meloxicam (MOBIC) 15 MG tablet One tab PO qAM with a meal for 2 weeks, then daily prn pain. 30 tablet 3  . NON FORMULARY Joint pill 1 tab po qd    . hydroxychloroquine (PLAQUENIL) 200 MG tablet Take 200 mg by mouth daily.     No facility-administered medications prior to visit.    Allergies  Allergen Reactions  . Asa [Aspirin]   . Peanuts [Peanut Oil] Swelling  . Penicillins     ROS Review of Systems    Objective:    Physical Exam Constitutional:      Appearance: She is well-developed.  HENT:     Head: Normocephalic and atraumatic.  Cardiovascular:     Rate and Rhythm: Normal rate and regular rhythm.     Heart sounds: Murmur heard.      Comments: 2/6 SEM, high pitched Pulmonary:     Effort: Pulmonary effort is normal.     Breath sounds: Normal breath sounds.  Skin:    General: Skin is warm and dry.  Neurological:     Mental Status: She is alert and oriented to person, place, and time.  Psychiatric:        Behavior: Behavior normal.     BP 110/73   Pulse 90   Ht 5\' 5"  (1.651 m)   Wt 193 lb (87.5 kg)   SpO2 100%   BMI 32.12 kg/m  Wt Readings from Last 3 Encounters:  02/20/20 193 lb (87.5 kg)  04/09/17 190 lb (86.2 kg)  03/17/17 187 lb 9.6 oz (85.1 kg)     Health Maintenance Due  Topic Date Due  . COVID-19 Vaccine (1) Never done  . DEXA SCAN  Never done  . TETANUS/TDAP  07/21/2015  . INFLUENZA VACCINE  02/18/2020    There are no preventive care reminders to display for this patient.  Lab Results  Component Value Date   TSH 0.23 (L) 06/04/2017   Lab Results  Component Value Date   WBC 5.2 04/05/2017   HGB 14.4 04/05/2017   HCT 41.8 04/05/2017   MCV 87.8 04/05/2017   PLT 239 04/05/2017   Lab Results  Component Value Date   NA 142 12/09/2018   K 4.4 12/09/2018   CO2 27 04/05/2017   GLUCOSE 123 (H) 04/05/2017   BUN 21 12/09/2018   CREATININE 0.8 12/09/2018   BILITOT 0.5 04/05/2017   ALKPHOS 53 12/09/2018   AST 48 (A) 12/09/2018   ALT 41 (A) 12/09/2018   PROT 7.1 04/05/2017   ALBUMIN 4.1 02/12/2014   CALCIUM 10.1 04/05/2017   Lab Results  Component Value Date   CHOL 225 (H) 04/05/2017   Lab Results  Component Value Date   HDL 75 04/05/2017   Lab Results  Component Value Date   LDLCALC 130 (H) 04/05/2017   Lab Results  Component Value Date   TRIG 95 04/05/2017   Lab Results  Component Value Date   CHOLHDL 3.0 04/05/2017   Lab Results  Component Value Date   HGBA1C 6.9 (A) 02/20/2020      Assessment & Plan:   Problem List Items Addressed This Visit      Cardiovascular and Mediastinum   Essential hypertension, benign    Well controlled. Continue current regimen. Follow up in  6 mo      Relevant Orders   TSH   COMPLETE METABOLIC PANEL WITH GFR   Lipid Panel w/reflex Direct LDL     Endocrine   IFG (impaired fasting glucose)    Hemoglobin A1c is significantly elevated today.  I have not seen her in a little over a year and unfortunately her A1c is now up to 6.9.  We had a discussion today about some dietary changes including cutting back on sugars and carbs and how to do that.  Also encouraging increase in protein and vegetables.  Plan will be to follow back up in 3 months and recheck her A1c at that time if she is able to get it back down we can hopefully avoid medication but if not we did discuss that we would need  to start medication.      Relevant Orders   COMPLETE METABOLIC PANEL WITH GFR   Lipid Panel w/reflex Direct LDL   POCT glycosylated hemoglobin (Hb A1C) (Completed)   Hypothyroid - Primary    To recheck TSH.  Last TSH was almost 2 years ago and at that time it was 0.2.  She has had some recent hair loss so it may be that her dosing needs to be adjusted.  Plus we have not actually refilled her medication since December but again she is taking it regularly so it is unclear if someone else may have actually filled the prescription for her.      Relevant Orders   TSH   COMPLETE METABOLIC PANEL WITH GFR   Lipid Panel w/reflex Direct LDL     Musculoskeletal and Integument   Rheumatoid arthritis ([place])    Currently on Plaquenil.  She does feel like her pain is better but still gets a lot of joint swelling.  She has a fair amount of swelling of the MCPs on her left hand today.  Following with Dr. Posey Pronto here locally in [place].  Relevant Medications   hydroxychloroquine (PLAQUENIL) 200 MG tablet     Other   Heart murmur      No orders of the defined types were placed in this encounter.    Follow-up: Return in about 3 months (around 05/22/2020) for Follow-up elevated blood sugar levels..   Time Spent 30 minutes in encounter including reviewing notes in chart.  Beatrice Lecher, MD

## 2020-02-20 NOTE — Patient Instructions (Signed)
Diabetes Mellitus and Exercise Exercising regularly is important for your overall health, especially when you have diabetes (diabetes mellitus). Exercising is not only about losing weight. It has many other health benefits, such as increasing muscle strength and bone density and reducing body fat and stress. This leads to improved fitness, flexibility, and endurance, all of which result in better overall health. Exercise has additional benefits for people with diabetes, including:  Reducing appetite.  Helping to lower and control blood glucose.  Lowering blood pressure.  Helping to control amounts of fatty substances (lipids) in the blood, such as cholesterol and triglycerides.  Helping the body to respond better to insulin (improving insulin sensitivity).  Reducing how much insulin the body needs.  Decreasing the risk for heart disease by: ? Lowering cholesterol and triglyceride levels. ? Increasing the levels of good cholesterol. ? Lowering blood glucose levels. What is my activity plan? Your health care provider or certified diabetes educator can help you make a plan for the type and frequency of exercise (activity plan) that works for you. Make sure that you:  Do at least 150 minutes of moderate-intensity or vigorous-intensity exercise each week. This could be brisk walking, biking, or water aerobics. ? Do stretching and strength exercises, such as yoga or weightlifting, at least 2 times a week. ? Spread out your activity over at least 3 days of the week.  Get some form of physical activity every day. ? Do not go more than 2 days in a row without some kind of physical activity. ? Avoid being inactive for more than 30 minutes at a time. Take frequent breaks to walk or stretch.  Choose a type of exercise or activity that you enjoy, and set realistic goals.  Start slowly, and gradually increase the intensity of your exercise over time. What do I need to know about managing my  diabetes?   Check your blood glucose before and after exercising. ? If your blood glucose is 240 mg/dL (13.3 mmol/L) or higher before you exercise, check your urine for ketones. If you have ketones in your urine, do not exercise until your blood glucose returns to normal. ? If your blood glucose is 100 mg/dL (5.6 mmol/L) or lower, eat a snack containing 15-20 grams of carbohydrate. Check your blood glucose 15 minutes after the snack to make sure that your level is above 100 mg/dL (5.6 mmol/L) before you start your exercise.  Know the symptoms of low blood glucose (hypoglycemia) and how to treat it. Your risk for hypoglycemia increases during and after exercise. Common symptoms of hypoglycemia can include: ? Hunger. ? Anxiety. ? Sweating and feeling clammy. ? Confusion. ? Dizziness or feeling light-headed. ? Increased heart rate or palpitations. ? Blurry vision. ? Tingling or numbness around the mouth, lips, or tongue. ? Tremors or shakes. ? Irritability.  Keep a rapid-acting carbohydrate snack available before, during, and after exercise to help prevent or treat hypoglycemia.  Avoid injecting insulin into areas of the body that are going to be exercised. For example, avoid injecting insulin into: ? The arms, when playing tennis. ? The legs, when jogging.  Keep records of your exercise habits. Doing this can help you and your health care provider adjust your diabetes management plan as needed. Write down: ? Food that you eat before and after you exercise. ? Blood glucose levels before and after you exercise. ? The type and amount of exercise you have done. ? When your insulin is expected to peak, if you use   insulin. Avoid exercising at times when your insulin is peaking.  When you start a new exercise or activity, work with your health care provider to make sure the activity is safe for you, and to adjust your insulin, medicines, or food intake as needed.  Drink plenty of water while  you exercise to prevent dehydration or heat stroke. Drink enough fluid to keep your urine clear or pale yellow. Summary  Exercising regularly is important for your overall health, especially when you have diabetes (diabetes mellitus).  Exercising has many health benefits, such as increasing muscle strength and bone density and reducing body fat and stress.  Your health care provider or certified diabetes educator can help you make a plan for the type and frequency of exercise (activity plan) that works for you.  When you start a new exercise or activity, work with your health care provider to make sure the activity is safe for you, and to adjust your insulin, medicines, or food intake as needed. This information is not intended to replace advice given to you by your health care provider. Make sure you discuss any questions you have with your health care provider. Document Revised: 01/28/2017 Document Reviewed: 12/16/2015 Elsevier Patient Education  2020 [employer]  

## 2020-02-20 NOTE — Assessment & Plan Note (Signed)
Well controlled. Continue current regimen. Follow up in  6 mo  

## 2020-02-20 NOTE — Assessment & Plan Note (Signed)
Currently on Plaquenil.  She does feel like her pain is better but still gets a lot of joint swelling.  She has a fair amount of swelling of the MCPs on her left hand today.  Following with Dr. Posey Pronto here locally in [place].

## 2020-02-20 NOTE — Assessment & Plan Note (Signed)
Hemoglobin A1c is significantly elevated today.  I have not seen her in a little over a year and unfortunately her A1c is now up to 6.9.  We had a discussion today about some dietary changes including cutting back on sugars and carbs and how to do that.  Also encouraging increase in protein and vegetables.  Plan will be to follow back up in 3 months and recheck her A1c at that time if she is able to get it back down we can hopefully avoid medication but if not we did discuss that we would need to start medication.

## 2020-02-20 NOTE — Assessment & Plan Note (Signed)
To recheck TSH.  Last TSH was almost 2 years ago and at that time it was 0.2.  She has had some recent hair loss so it may be that her dosing needs to be adjusted.  Plus we have not actually refilled her medication since December but again she is taking it regularly so it is unclear if someone else may have actually filled the prescription for her.

## 2020-02-21 ENCOUNTER — Other Ambulatory Visit: Payer: Self-pay | Admitting: *Deleted

## 2020-02-21 DIAGNOSIS — E038 Other specified hypothyroidism: Secondary | ICD-10-CM

## 2020-02-21 LAB — COMPLETE METABOLIC PANEL WITH GFR
AG Ratio: 1.5 (calc) (ref 1.0–2.5)
ALT: 29 U/L (ref 6–29)
AST: 33 U/L (ref 10–35)
Albumin: 4.6 g/dL (ref 3.6–5.1)
Alkaline phosphatase (APISO): 59 U/L (ref 37–153)
BUN: 18 mg/dL (ref 7–25)
CO2: 29 mmol/L (ref 20–32)
Calcium: 11 mg/dL — ABNORMAL HIGH (ref 8.6–10.4)
Chloride: 106 mmol/L (ref 98–110)
Creat: 0.92 mg/dL (ref 0.60–0.93)
GFR, Est African American: 70 mL/min/{1.73_m2} (ref >=60)
GFR, Est Non African American: 60 mL/min/{1.73_m2} (ref >=60)
Globulin: 3 g/dL (calc) (ref 1.9–3.7)
Glucose, Bld: 116 mg/dL — ABNORMAL HIGH (ref 65–99)
Potassium: 4.3 mmol/L (ref 3.5–5.3)
Sodium: 145 mmol/L (ref 135–146)
Total Bilirubin: 0.6 mg/dL (ref 0.2–1.2)
Total Protein: 7.6 g/dL (ref 6.1–8.1)

## 2020-02-21 LAB — LIPID PANEL W/REFLEX DIRECT LDL
Cholesterol: 197 mg/dL (ref <200)
HDL: 68 mg/dL (ref >=50)
LDL Cholesterol (Calc): 106 mg/dL (calc) — ABNORMAL HIGH
Non-HDL Cholesterol (Calc): 129 mg/dL (calc) (ref <130)
Total CHOL/HDL Ratio: 2.9 (calc) (ref <5.0)
Triglycerides: 132 mg/dL (ref <150)

## 2020-02-21 LAB — TSH: TSH: 4.34 mIU/L (ref 0.40–4.50)

## 2020-02-21 MED ORDER — LEVOTHYROXINE SODIUM 88 MCG PO TABS: ORAL_TABLET | ORAL | 1 refills | Status: AC

## 2020-04-19 DEATH — deceased

## 2020-05-23 ENCOUNTER — Ambulatory Visit: Payer: Medicare Other | Admitting: Family Medicine
